# Patient Record
Sex: Female | Born: 1985 | Race: Black or African American | Hispanic: No | Marital: Single | Smoking: Never smoker
Health system: Southern US, Community
[De-identification: ages and names within clinical notes are randomized; demographics above are authoritative.]

## PROBLEM LIST (undated history)

## (undated) DIAGNOSIS — K219 Gastro-esophageal reflux disease without esophagitis: Secondary | ICD-10-CM

## (undated) DIAGNOSIS — R718 Other abnormality of red blood cells: Secondary | ICD-10-CM | POA: Insufficient documentation

## (undated) DIAGNOSIS — F419 Anxiety disorder, unspecified: Secondary | ICD-10-CM

## (undated) DIAGNOSIS — E559 Vitamin D deficiency, unspecified: Secondary | ICD-10-CM | POA: Insufficient documentation

## (undated) DIAGNOSIS — F32A Depression, unspecified: Secondary | ICD-10-CM

## (undated) DIAGNOSIS — T7840XA Allergy, unspecified, initial encounter: Secondary | ICD-10-CM

## (undated) DIAGNOSIS — E119 Type 2 diabetes mellitus without complications: Secondary | ICD-10-CM

## (undated) HISTORY — DX: Depression, unspecified: F32.A

## (undated) HISTORY — DX: Type 2 diabetes mellitus without complications: E11.9

## (undated) HISTORY — PX: OTHER SURGICAL HISTORY: SHX169

## (undated) HISTORY — DX: Allergy, unspecified, initial encounter: T78.40XA

## (undated) HISTORY — DX: Anxiety disorder, unspecified: F41.9

## (undated) HISTORY — DX: Gastro-esophageal reflux disease without esophagitis: K21.9

---

## 2006-08-29 ENCOUNTER — Emergency Department (HOSPITAL_COMMUNITY): Admission: EM | Admit: 2006-08-29 | Discharge: 2006-08-29 | Payer: Self-pay | Admitting: Emergency Medicine

## 2007-03-03 ENCOUNTER — Emergency Department (HOSPITAL_COMMUNITY): Admission: EM | Admit: 2007-03-03 | Discharge: 2007-03-03 | Payer: Self-pay | Admitting: Emergency Medicine

## 2008-06-25 ENCOUNTER — Emergency Department (HOSPITAL_COMMUNITY): Admission: EM | Admit: 2008-06-25 | Discharge: 2008-06-25 | Payer: Self-pay | Admitting: Emergency Medicine

## 2008-07-25 ENCOUNTER — Inpatient Hospital Stay (HOSPITAL_COMMUNITY): Admission: AD | Admit: 2008-07-25 | Discharge: 2008-07-25 | Payer: Self-pay | Admitting: Obstetrics and Gynecology

## 2008-10-13 ENCOUNTER — Inpatient Hospital Stay (HOSPITAL_COMMUNITY): Admission: AD | Admit: 2008-10-13 | Discharge: 2008-10-13 | Payer: Self-pay | Admitting: Radiology

## 2008-11-28 ENCOUNTER — Inpatient Hospital Stay (HOSPITAL_COMMUNITY): Admission: AD | Admit: 2008-11-28 | Discharge: 2008-11-28 | Payer: Self-pay | Admitting: Obstetrics & Gynecology

## 2009-02-05 ENCOUNTER — Inpatient Hospital Stay (HOSPITAL_COMMUNITY): Admission: AD | Admit: 2009-02-05 | Discharge: 2009-02-06 | Payer: Self-pay | Admitting: Obstetrics & Gynecology

## 2009-09-20 ENCOUNTER — Inpatient Hospital Stay (HOSPITAL_COMMUNITY): Admission: AD | Admit: 2009-09-20 | Discharge: 2009-09-20 | Payer: Self-pay | Admitting: Family Medicine

## 2010-01-13 ENCOUNTER — Emergency Department (HOSPITAL_COMMUNITY): Admission: EM | Admit: 2010-01-13 | Discharge: 2010-01-13 | Payer: Self-pay | Admitting: Emergency Medicine

## 2010-03-18 ENCOUNTER — Emergency Department (HOSPITAL_COMMUNITY): Admission: EM | Admit: 2010-03-18 | Discharge: 2010-03-18 | Payer: Self-pay | Admitting: Family Medicine

## 2010-07-01 ENCOUNTER — Emergency Department (HOSPITAL_COMMUNITY): Admission: EM | Admit: 2010-07-01 | Discharge: 2010-07-01 | Payer: Self-pay | Admitting: Emergency Medicine

## 2011-02-28 LAB — COMPREHENSIVE METABOLIC PANEL
Alkaline Phosphatase: 89 U/L (ref 39–117)
BUN: 7 mg/dL (ref 6–23)
CO2: 28 mEq/L (ref 19–32)
Calcium: 9.7 mg/dL (ref 8.4–10.5)
Creatinine, Ser: 0.61 mg/dL (ref 0.4–1.2)
GFR calc non Af Amer: >60 mL/min (ref >60)
Glucose, Bld: 90 mg/dL (ref 70–99)
Potassium: 4.5 mEq/L (ref 3.5–5.1)
Total Bilirubin: 0.6 mg/dL (ref 0.3–1.2)

## 2011-02-28 LAB — CBC
Hemoglobin: 13.4 g/dL (ref 12.0–15.0)
Platelets: 255 10*3/uL (ref 150–400)
RBC: 5.15 MIL/uL — ABNORMAL HIGH (ref 3.87–5.11)
WBC: 6.4 10*3/uL (ref 4.0–10.5)

## 2011-02-28 LAB — DIFFERENTIAL
Lymphs Abs: 1.7 10*3/uL (ref 0.7–4.0)
Neutro Abs: 3.9 10*3/uL (ref 1.7–7.7)
Neutrophils Relative %: 62 % (ref 43–77)

## 2011-02-28 LAB — POCT CARDIAC MARKERS
CKMB, poc: 1.3 ng/mL (ref 1.0–8.0)
Myoglobin, poc: 58.9 ng/mL (ref 12–200)
Troponin i, poc: <0.05 ng/mL (ref 0.00–0.09)

## 2011-03-04 LAB — POCT URINALYSIS DIP (DEVICE)
Hgb urine dipstick: NEGATIVE
Nitrite: NEGATIVE
Specific Gravity, Urine: >=1.03 (ref 1.005–1.030)
Urobilinogen, UA: 1 mg/dL (ref 0.0–1.0)

## 2011-03-19 LAB — DIFFERENTIAL
Eosinophils Relative: 1 % (ref 0–5)
Lymphocytes Relative: 18 % (ref 12–46)
Monocytes Absolute: 0.7 10*3/uL (ref 0.1–1.0)
Neutro Abs: 5.5 10*3/uL (ref 1.7–7.7)

## 2011-03-19 LAB — CBC
Hemoglobin: 12.9 g/dL (ref 12.0–15.0)
MCHC: 32.6 g/dL (ref 30.0–36.0)
MCV: 77.1 fL — ABNORMAL LOW (ref 78.0–100.0)
Platelets: 269 10*3/uL (ref 150–400)
RBC: 5.14 MIL/uL — ABNORMAL HIGH (ref 3.87–5.11)
RDW: 15.9 % — ABNORMAL HIGH (ref 11.5–15.5)

## 2011-03-19 LAB — WET PREP, GENITAL

## 2011-03-19 LAB — GC/CHLAMYDIA PROBE AMP, GENITAL: Chlamydia, DNA Probe: NEGATIVE

## 2011-03-31 LAB — URINALYSIS, ROUTINE W REFLEX MICROSCOPIC
Glucose, UA: NEGATIVE mg/dL
Hgb urine dipstick: NEGATIVE
Ketones, ur: 40 mg/dL — AB
Protein, ur: NEGATIVE mg/dL
Specific Gravity, Urine: >1.03 — ABNORMAL HIGH (ref 1.005–1.030)
Urobilinogen, UA: 1 mg/dL (ref 0.0–1.0)

## 2011-03-31 LAB — WET PREP, GENITAL
Trich, Wet Prep: NONE SEEN
Yeast Wet Prep HPF POC: NONE SEEN

## 2011-09-10 LAB — HCG, QUANTITATIVE, PREGNANCY: hCG, Beta Chain, Quant, S: 1336 — ABNORMAL HIGH

## 2011-09-10 LAB — WET PREP, GENITAL
Trich, Wet Prep: NONE SEEN
WBC, Wet Prep HPF POC: NONE SEEN
Yeast Wet Prep HPF POC: NONE SEEN

## 2011-09-10 LAB — URINE MICROSCOPIC-ADD ON

## 2011-09-10 LAB — URINALYSIS, ROUTINE W REFLEX MICROSCOPIC
Bilirubin Urine: NEGATIVE
Glucose, UA: NEGATIVE
Ketones, ur: NEGATIVE
Specific Gravity, Urine: 1.029
pH: 7

## 2011-09-10 LAB — POCT I-STAT, CHEM 8
BUN: 10
Calcium, Ion: 1.12
Glucose, Bld: 89
TCO2: 21

## 2011-09-10 LAB — URINE CULTURE: Colony Count: 75000

## 2011-09-10 LAB — GC/CHLAMYDIA PROBE AMP, GENITAL
Chlamydia, DNA Probe: NEGATIVE
GC Probe Amp, Genital: NEGATIVE

## 2011-09-10 LAB — RPR: RPR Ser Ql: NONREACTIVE

## 2011-09-11 LAB — WET PREP, GENITAL: Yeast Wet Prep HPF POC: NONE SEEN

## 2011-09-18 LAB — URINALYSIS, ROUTINE W REFLEX MICROSCOPIC
Hgb urine dipstick: NEGATIVE
Nitrite: NEGATIVE
Protein, ur: NEGATIVE mg/dL
Specific Gravity, Urine: 1.02 (ref 1.005–1.030)
Urobilinogen, UA: 1 mg/dL (ref 0.0–1.0)

## 2011-09-18 LAB — COMPREHENSIVE METABOLIC PANEL
Albumin: 2.7 g/dL — ABNORMAL LOW (ref 3.5–5.2)
Alkaline Phosphatase: 78 U/L (ref 39–117)
BUN: 6 mg/dL (ref 6–23)
CO2: 24 mEq/L (ref 19–32)
Chloride: 106 mEq/L (ref 96–112)
Creatinine, Ser: 0.53 mg/dL (ref 0.4–1.2)
GFR calc non Af Amer: >60 mL/min (ref >60)
Glucose, Bld: 105 mg/dL — ABNORMAL HIGH (ref 70–99)
Potassium: 4.2 mEq/L (ref 3.5–5.1)
Total Bilirubin: 0.5 mg/dL (ref 0.3–1.2)

## 2011-09-18 LAB — CBC
HCT: 36.2 % (ref 36.0–46.0)
Hemoglobin: 12.1 g/dL (ref 12.0–15.0)
MCV: 79.7 fL (ref 78.0–100.0)
RBC: 4.55 MIL/uL (ref 3.87–5.11)
WBC: 10 10*3/uL (ref 4.0–10.5)

## 2017-02-18 DIAGNOSIS — I10 Essential (primary) hypertension: Secondary | ICD-10-CM | POA: Insufficient documentation

## 2019-05-22 DIAGNOSIS — E119 Type 2 diabetes mellitus without complications: Secondary | ICD-10-CM | POA: Insufficient documentation

## 2020-10-24 ENCOUNTER — Encounter: Payer: Self-pay | Admitting: Family Medicine

## 2020-10-24 ENCOUNTER — Other Ambulatory Visit: Payer: Self-pay | Admitting: Family Medicine

## 2020-10-24 ENCOUNTER — Other Ambulatory Visit: Payer: Self-pay

## 2020-10-24 ENCOUNTER — Telehealth (INDEPENDENT_AMBULATORY_CARE_PROVIDER_SITE_OTHER): Payer: No Typology Code available for payment source | Admitting: Family Medicine

## 2020-10-24 VITALS — BP 136/80 | HR 89 | Temp 98.0°F | Ht 64.25 in | Wt 346.2 lb

## 2020-10-24 DIAGNOSIS — E1165 Type 2 diabetes mellitus with hyperglycemia: Secondary | ICD-10-CM | POA: Diagnosis not present

## 2020-10-24 DIAGNOSIS — Z7689 Persons encountering health services in other specified circumstances: Secondary | ICD-10-CM

## 2020-10-24 DIAGNOSIS — R87619 Unspecified abnormal cytological findings in specimens from cervix uteri: Secondary | ICD-10-CM

## 2020-10-24 DIAGNOSIS — I10 Essential (primary) hypertension: Secondary | ICD-10-CM

## 2020-10-24 DIAGNOSIS — F419 Anxiety disorder, unspecified: Secondary | ICD-10-CM

## 2020-10-24 DIAGNOSIS — F32A Depression, unspecified: Secondary | ICD-10-CM

## 2020-10-24 DIAGNOSIS — Z30433 Encounter for removal and reinsertion of intrauterine contraceptive device: Secondary | ICD-10-CM

## 2020-10-24 DIAGNOSIS — K219 Gastro-esophageal reflux disease without esophagitis: Secondary | ICD-10-CM

## 2020-10-24 MED ORDER — GLIPIZIDE ER 5 MG PO TB24: 5.0000 mg | ORAL_TABLET | Freq: Every day | ORAL | 3 refills | Status: DC

## 2020-10-24 MED ORDER — HYDROCHLOROTHIAZIDE 12.5 MG PO CAPS: 12.5000 mg | ORAL_CAPSULE | Freq: Every day | ORAL | 3 refills | Status: DC

## 2020-10-24 MED ORDER — METFORMIN HCL ER 500 MG PO TB24: 1000.0000 mg | ORAL_TABLET | Freq: Every day | ORAL | 3 refills | Status: DC

## 2020-10-24 MED ORDER — ESCITALOPRAM OXALATE 10 MG PO TABS: 10.0000 mg | ORAL_TABLET | Freq: Every day | ORAL | 3 refills | Status: DC

## 2020-10-24 MED ORDER — PANTOPRAZOLE SODIUM 40 MG PO TBEC: 40.0000 mg | DELAYED_RELEASE_TABLET | Freq: Every day | ORAL | 3 refills | Status: DC

## 2020-10-24 MED FILL — glipiZIDE XL 5 MG TB24: 5 | 90 days supply | Qty: 90 | Fill #0

## 2020-10-24 MED FILL — PANTOPRAZOLE SOD DR 40 MG T: 40 | 90 days supply | Qty: 90 | Fill #0

## 2020-10-24 MED FILL — METFORMIN HCL ER 500 MG TB2: 500 | 90 days supply | Qty: 180 | Fill #0

## 2020-10-24 MED FILL — HYDROCHLOROTHIAZIDE 12.5 MG: 12.5 | 90 days supply | Qty: 90 | Fill #0

## 2020-10-24 MED FILL — ESCITALOPRAM 10 MG TABLET: 10 | 90 days supply | Qty: 90 | Fill #0

## 2020-10-24 NOTE — Progress Notes (Signed)
Virtual Visit via Video Note  I connected with Cynthia Bautista on 10/24/20 at  2:00 PM EST by a video enabled telemedicine application and verified that I am speaking with the correct person using two identifiers. Location patient: home Location provider:  home office Persons participating in the virtual visit: patient, provider  I discussed the limitations of evaluation and management by telemedicine and the availability of in person appointments. The patient expressed understanding and agreed to proceed.  Chief Complaint  Patient presents with   Establish Care    NP- establish care and med refills on DM and GERD     HPI: Cynthia Bautista is a 34 y.o. female seen today as a new patient to establish care with our office. She has a PMHx significant for uncontrolled DM, GERD, HTN. She needs refills of her meds today. For DM pt is taking metformin XR 1000mg  daily, glipizide XL 5mg  daily. She does not check her BS at home. A1C in 02/2020 = 10. She has been out of metformin x 1 mo. For GERD, pt is taking protonix 40mg  daily and feels it is effective. She been out of this x 2 wks.  For HTN, pt takes HCTZ 12.5mg  daily.   Previous PCP:  Specialists: GYN at [facility] - needs mirena changed  Needs referral to GYN for removal and reinsertion of IUD and ? Abnormal PAP about 6 mo ago that she was told needed to be repeated.  She wants to discuss med for anxiety and depression. Brother passed away almost 2 years ago from covid. She was very close to her brother and still close to her SIL and nieces and nephew.    Past Medical History:  Diagnosis Date   Allergy    Anxiety    Depression    Diabetes mellitus without complication (HCC)    GERD (gastroesophageal reflux disease)     Past Surgical History:  Procedure Laterality Date   adnoids     tonsil      Family History  Problem Relation Age of Onset   Diabetes Mother    Hypertension Mother    Arthritis Mother     Diabetes Father    Hypertension Father    Hyperlipidemia Father    Arthritis Father    Cancer Paternal Grandmother    Kidney disease Paternal Grandmother    Hypertension Paternal Grandmother     Social History   Tobacco Use   Smoking status: Never Smoker   Smokeless tobacco: Never Used  03/2020 Use: Never used  Substance Use Topics   Alcohol use: Yes    Comment: occasional   Drug use: Never     Current Outpatient Medications:    albuterol (VENTOLIN HFA) 108 (90 Base) MCG/ACT inhaler, Inhale into the lungs., Disp: , Rfl:    Blood Glucose Monitoring Suppl (PRECISION XTRA) DEVI, Check blood glucose daily, Disp: , Rfl:    glipiZIDE (GLUCOTROL XL) 5 MG 24 hr tablet, Take 1 tablet (5 mg total) by mouth daily with breakfast., Disp: 90 tablet, Rfl: 3   glucose blood (ONETOUCH VERIO) test strip, Check blood glucose daily, Disp: , Rfl:    hydrochlorothiazide (MICROZIDE) 12.5 MG capsule, Take 1 capsule (12.5 mg total) by mouth daily., Disp: 90 capsule, Rfl: 3   metFORMIN (GLUCOPHAGE-XR) 500 MG 24 hr tablet, Take 2 tablets (1,000 mg total) by mouth daily with breakfast., Disp: 180 tablet, Rfl: 3   OneTouch Delica Lancets 33G MISC, Check blood glucose daily, Disp: ,  Rfl:    pantoprazole (PROTONIX) 40 MG tablet, Take 1 tablet (40 mg total) by mouth daily., Disp: 90 tablet, Rfl: 3   escitalopram (LEXAPRO) 10 MG tablet, Take 1 tablet (10 mg total) by mouth daily., Disp: 90 tablet, Rfl: 3   fluticasone (FLONASE) 50 MCG/ACT nasal spray, fluticasone propionate 50 mcg/actuation nasal spray,suspension  1 spray everyday to each nostril, Disp: , Rfl:   Allergies  Allergen Reactions   Morphine Anxiety      ROS: See pertinent positives and negatives per HPI.   EXAM:  VITALS per patient if applicable: BP 136/80    Pulse 89    Temp 98 F (36.7 C) (Temporal)    Ht 5' 4.25" (1.632 m)    Wt (!) 346 lb 3.2 oz (157 kg)    BMI 58.96 kg/m    GENERAL: alert,  oriented, appears well and in no acute distress, obese  HEENT: atraumatic, conjunctiva clear, no obvious abnormalities on inspection of external nose and ears  NECK: normal movements of the head and neck  LUNGS: on inspection no signs of respiratory distress, breathing rate appears normal, no obvious gross SOB, gasping or wheezing, no conversational dyspnea  CV: no obvious cyanosis  MS: moves all visible extremities without noticeable abnormality  PSYCH/NEURO: pleasant and cooperative, no obvious depression or anxiety, speech and thought processing grossly intact   ASSESSMENT AND PLAN:  1. Encounter to establish care with new doctor  2. Uncontrolled type 2 diabetes mellitus with hyperglycemia (HCC) - last A1C = 10.2 in 02/2020 - she has been out of metformin x 1 mo Refill: - metFORMIN (GLUCOPHAGE-XR) 500 MG 24 hr tablet; Take 2 tablets (1,000 mg total) by mouth daily with breakfast.  Dispense: 180 tablet; Refill: 3 - glipiZIDE (GLUCOTROL XL) 5 MG 24 hr tablet; Take 1 tablet (5 mg total) by mouth daily with breakfast.  Dispense: 90 tablet; Refill: 3 - Microalbumin / creatinine urine ratio; Future - Hemoglobin A1c; Future - Lipid panel; Future - CBC; Future - Comprehensive metabolic panel; Future  3. Gastroesophageal reflux disease, unspecified whether esophagitis present - controlled on med Refill: - pantoprazole (PROTONIX) 40 MG tablet; Take 1 tablet (40 mg total) by mouth daily.  Dispense: 90 tablet; Refill: 3  4. Primary hypertension - controlled, at goal Refill: - hydrochlorothiazide (MICROZIDE) 12.5 MG capsule; Take 1 capsule (12.5 mg total) by mouth daily.  Dispense: 90 capsule; Refill: 3 - Comprehensive metabolic panel; Future  5. Encounter for removal and reinsertion of IUD 6. Abnormal cervical Papanicolaou smear, unspecified abnormal pap finding - Ambulatory referral to Obstetrics / Gynecology  7. Anxiety and depression - 2 children, works full time, brother  passed away last year from covid Rx: - escitalopram (LEXAPRO) 10 MG tablet; Take 1 tablet (10 mg total) by mouth daily.  Dispense: 90 tablet; Refill: 3 - f/u in 3-4 wks or sooner PRN Discussed plan and reviewed medications with patient, including risks, benefits, and potential side effects. Pt expressed understand. All questions answered.     I discussed the assessment and treatment plan with the patient. The patient was provided an opportunity to ask questions and all were answered. The patient agreed with the plan and demonstrated an understanding of the instructions.   The patient was advised to call back or seek an in-person evaluation if the symptoms worsen or if the condition fails to improve as anticipated.   Luana Shu, DO

## 2020-10-25 ENCOUNTER — Other Ambulatory Visit: Payer: No Typology Code available for payment source

## 2020-10-28 ENCOUNTER — Other Ambulatory Visit: Payer: Self-pay

## 2020-10-28 ENCOUNTER — Other Ambulatory Visit (INDEPENDENT_AMBULATORY_CARE_PROVIDER_SITE_OTHER): Payer: No Typology Code available for payment source

## 2020-10-28 DIAGNOSIS — I10 Essential (primary) hypertension: Secondary | ICD-10-CM

## 2020-10-28 DIAGNOSIS — E1165 Type 2 diabetes mellitus with hyperglycemia: Secondary | ICD-10-CM

## 2020-10-28 LAB — COMPREHENSIVE METABOLIC PANEL
ALT: 12 U/L (ref 0–35)
AST: 16 U/L (ref 0–37)
Albumin: 3.9 g/dL (ref 3.5–5.2)
Alkaline Phosphatase: 62 U/L (ref 39–117)
BUN: 7 mg/dL (ref 6–23)
CO2: 28 mEq/L (ref 19–32)
Calcium: 9.3 mg/dL (ref 8.4–10.5)
Chloride: 101 mEq/L (ref 96–112)
Creatinine, Ser: 0.65 mg/dL (ref 0.40–1.20)
GFR: 115.2 mL/min (ref >60.00)
Glucose, Bld: 120 mg/dL — ABNORMAL HIGH (ref 70–99)
Potassium: 4.1 mEq/L (ref 3.5–5.1)
Sodium: 136 mEq/L (ref 135–145)
Total Bilirubin: 0.5 mg/dL (ref 0.2–1.2)
Total Protein: 7 g/dL (ref 6.0–8.3)

## 2020-10-28 LAB — LIPID PANEL
Cholesterol: 201 mg/dL — ABNORMAL HIGH (ref 0–200)
HDL: 42.7 mg/dL (ref >39.00)
LDL Cholesterol: 139 mg/dL — ABNORMAL HIGH (ref 0–99)
NonHDL: 158.52
Total CHOL/HDL Ratio: 5
Triglycerides: 100 mg/dL (ref 0.0–149.0)
VLDL: 20 mg/dL (ref 0.0–40.0)

## 2020-10-28 LAB — CBC
HCT: 41.7 % (ref 36.0–46.0)
Hemoglobin: 13.4 g/dL (ref 12.0–15.0)
MCHC: 32.2 g/dL (ref 30.0–36.0)
MCV: 78.5 fl (ref 78.0–100.0)
Platelets: 265 10*3/uL (ref 150.0–400.0)
RBC: 5.31 Mil/uL — ABNORMAL HIGH (ref 3.87–5.11)
RDW: 14.3 % (ref 11.5–15.5)
WBC: 6.1 10*3/uL (ref 4.0–10.5)

## 2020-10-28 LAB — HEMOGLOBIN A1C: Hgb A1c MFr Bld: 6.6 % — ABNORMAL HIGH (ref 4.6–6.5)

## 2020-10-28 LAB — MICROALBUMIN / CREATININE URINE RATIO
Creatinine,U: 264.8 mg/dL
Microalb Creat Ratio: 0.7 mg/g (ref 0.0–30.0)
Microalb, Ur: 1.9 mg/dL (ref 0.0–1.9)

## 2020-10-28 NOTE — Addendum Note (Signed)
Addended by: Varney Biles on: 10/28/2020 10:59 AM   Modules accepted: Orders

## 2020-10-30 ENCOUNTER — Encounter: Payer: Self-pay | Admitting: Family Medicine

## 2020-10-30 DIAGNOSIS — E785 Hyperlipidemia, unspecified: Secondary | ICD-10-CM

## 2020-10-30 DIAGNOSIS — E119 Type 2 diabetes mellitus without complications: Secondary | ICD-10-CM

## 2020-10-31 ENCOUNTER — Other Ambulatory Visit: Payer: Self-pay | Admitting: Family Medicine

## 2020-10-31 MED ORDER — ATORVASTATIN CALCIUM 10 MG PO TABS: 10.0000 mg | ORAL_TABLET | Freq: Every day | ORAL | 3 refills | Status: DC

## 2020-10-31 MED FILL — ATORVASTATIN CALCIUM 10 MG: 10 | 90 days supply | Qty: 90 | Fill #0

## 2020-11-12 MED FILL — ATORVASTATIN CALCIUM 10 MG: 10 | 90 days supply | Qty: 90 | Fill #0

## 2020-11-13 ENCOUNTER — Encounter: Payer: Self-pay | Admitting: Family Medicine

## 2020-11-13 DIAGNOSIS — F32A Depression, unspecified: Secondary | ICD-10-CM

## 2020-11-14 ENCOUNTER — Other Ambulatory Visit: Payer: Self-pay | Admitting: Family Medicine

## 2020-11-14 ENCOUNTER — Ambulatory Visit: Payer: Self-pay | Admitting: Family Medicine

## 2020-11-14 MED ORDER — FLUOXETINE HCL 10 MG PO CAPS: 10.0000 mg | ORAL_CAPSULE | Freq: Every day | ORAL | 2 refills | Status: DC

## 2020-11-14 MED FILL — FLUoxetine HCL 10 MG CAPS: 10 | 30 days supply | Qty: 30 | Fill #0

## 2020-12-05 ENCOUNTER — Encounter: Payer: No Typology Code available for payment source | Admitting: Family Medicine

## 2020-12-17 ENCOUNTER — Encounter: Payer: Self-pay | Admitting: Family Medicine

## 2020-12-17 DIAGNOSIS — K219 Gastro-esophageal reflux disease without esophagitis: Secondary | ICD-10-CM

## 2020-12-18 ENCOUNTER — Other Ambulatory Visit: Payer: Self-pay | Admitting: Family Medicine

## 2020-12-18 ENCOUNTER — Ambulatory Visit: Payer: No Typology Code available for payment source | Admitting: Family Medicine

## 2020-12-18 MED ORDER — PANTOPRAZOLE SODIUM 40 MG PO TBEC: 40.0000 mg | DELAYED_RELEASE_TABLET | Freq: Two times a day (BID) | ORAL | 3 refills | Status: DC

## 2020-12-18 MED FILL — PANTOPRAZOLE SOD DR 40 MG T: 40 | 90 days supply | Qty: 180 | Fill #0

## 2020-12-19 ENCOUNTER — Other Ambulatory Visit: Payer: Self-pay

## 2020-12-20 ENCOUNTER — Other Ambulatory Visit: Payer: Self-pay | Admitting: Family Medicine

## 2020-12-20 ENCOUNTER — Ambulatory Visit (INDEPENDENT_AMBULATORY_CARE_PROVIDER_SITE_OTHER): Payer: No Typology Code available for payment source | Admitting: Family Medicine

## 2020-12-20 ENCOUNTER — Encounter: Payer: Self-pay | Admitting: Family Medicine

## 2020-12-20 VITALS — BP 126/74 | HR 89 | Temp 98.0°F | Ht 64.5 in | Wt 348.2 lb

## 2020-12-20 DIAGNOSIS — F419 Anxiety disorder, unspecified: Secondary | ICD-10-CM | POA: Diagnosis not present

## 2020-12-20 DIAGNOSIS — F32A Depression, unspecified: Secondary | ICD-10-CM | POA: Diagnosis not present

## 2020-12-20 MED ORDER — ALBUTEROL SULFATE HFA 108 (90 BASE) MCG/ACT IN AERS: 2.0000 | INHALATION_SPRAY | RESPIRATORY_TRACT | 2 refills | Status: DC | PRN

## 2020-12-20 MED FILL — ALBUTEROL SULFATE HFA 108 (: 108 (90 BAS | 17 days supply | Qty: 18 | Fill #0

## 2020-12-20 NOTE — Progress Notes (Signed)
Cynthia Bautista is a 35 y.o. female  Chief Complaint  Patient presents with  . Follow-up    F/u meds. Fluoxetine working well for her.  She wants refill on Albuterol inhaler.     HPI: Cynthia Bautista is a 35 y.o. female seen today for f/u on anxiety and depression. Pt is taking prozac 10mg  daily. She did not tolerate lexapro.  Today, pt reports prozac is a good fit. She feels "mellowed out" compared to when off medication. No side effects.    GAD 7 : Generalized Anxiety Score 10/24/2020  Nervous, Anxious, on Edge 0  Control/stop worrying 1  Worry too much - different things 1  Trouble relaxing 1  Restless 0  Easily annoyed or irritable 1  Afraid - awful might happen 0  Total GAD 7 Score 4  Anxiety Difficulty Somewhat difficult    Depression screen PHQ 2/9 10/24/2020  Decreased Interest 1  Down, Depressed, Hopeless 1  PHQ - 2 Score 2  Altered sleeping 1  Tired, decreased energy 1  Change in appetite 1  Feeling bad or failure about yourself  0  Trouble concentrating 0  Moving slowly or fidgety/restless 0  Suicidal thoughts 0  PHQ-9 Score 5  Difficult doing work/chores Somewhat difficult     Past Medical History:  Diagnosis Date  . Allergy   . Anxiety   . Depression   . Diabetes mellitus without complication (HCC)   . GERD (gastroesophageal reflux disease)     Past Surgical History:  Procedure Laterality Date  . adnoids    . tonsil      Social History   Socioeconomic History  . Marital status: Single    Spouse name: Not on file  . Number of children: Not on file  . Years of education: Not on file  . Highest education level: Not on file  Occupational History  . Not on file  Tobacco Use  . Smoking status: Never Smoker  . Smokeless tobacco: Never Used  Vaping Use  . Vaping Use: Never used  Substance and Sexual Activity  . Alcohol use: Yes    Comment: occasional  . Drug use: Never  . Sexual activity: Not Currently  Other Topics Concern  . Not on  file  Social History Narrative  . Not on file   Social Determinants of Health   Financial Resource Strain: Not on file  Food Insecurity: Not on file  Transportation Needs: Not on file  Physical Activity: Not on file  Stress: Not on file  Social Connections: Not on file  Intimate Partner Violence: Not on file    Family History  Problem Relation Age of Onset  . Diabetes Mother   . Hypertension Mother   . Arthritis Mother   . Diabetes Father   . Hypertension Father   . Hyperlipidemia Father   . Arthritis Father   . Cancer Paternal Grandmother   . Kidney disease Paternal Grandmother   . Hypertension Paternal Grandmother      Immunization History  Administered Date(s) Administered  . Influenza-Unspecified 09/18/2020  . PFIZER SARS-COV-2 Vaccination 03/27/2020, 04/18/2020    Outpatient Encounter Medications as of 12/20/2020  Medication Sig  . albuterol (VENTOLIN HFA) 108 (90 Base) MCG/ACT inhaler Inhale into the lungs.  02/17/2021 atorvastatin (LIPITOR) 10 MG tablet Take 1 tablet (10 mg total) by mouth daily.  . Blood Glucose Monitoring Suppl (PRECISION XTRA) DEVI Check blood glucose daily  . FLUoxetine (PROZAC) 10 MG capsule Take 1 capsule (10 mg total)  by mouth daily.  Marland Kitchen glipiZIDE (GLUCOTROL XL) 5 MG 24 hr tablet Take 1 tablet (5 mg total) by mouth daily with breakfast.  . glucose blood (ONETOUCH VERIO) test strip Check blood glucose daily  . hydrochlorothiazide (MICROZIDE) 12.5 MG capsule Take 1 capsule (12.5 mg total) by mouth daily.  . metFORMIN (GLUCOPHAGE-XR) 500 MG 24 hr tablet Take 2 tablets (1,000 mg total) by mouth daily with breakfast.  . OneTouch Delica Lancets 33G MISC Check blood glucose daily  . pantoprazole (PROTONIX) 40 MG tablet Take 1 tablet (40 mg total) by mouth 2 (two) times daily.  . fluticasone (FLONASE) 50 MCG/ACT nasal spray fluticasone propionate 50 mcg/actuation nasal spray,suspension  1 spray everyday to each nostril (Patient not taking: Reported on  12/20/2020)   No facility-administered encounter medications on file as of 12/20/2020.     ROS: Pertinent positives and negatives noted in HPI. Remainder of ROS non-contributory    Allergies  Allergen Reactions  . Morphine Anxiety    BP 126/74   Pulse 89   Temp 98 F (36.7 C) (Temporal)   Ht 5' 4.5" (1.638 m)   Wt (!) 348 lb 3.2 oz (157.9 kg)   SpO2 98%   BMI 58.85 kg/m   Physical Exam Constitutional:      General: She is not in acute distress.    Appearance: Normal appearance. She is not ill-appearing.  Pulmonary:     Effort: No respiratory distress.  Neurological:     Mental Status: She is alert and oriented to person, place, and time.  Psychiatric:        Mood and Affect: Mood normal.        Behavior: Behavior normal.      A/P:  1. Anxiety and depression - much-improved on prozac 10mg   - did not tolerate lexapro - pt is planning to start regular exercise with daughter - f/u PRN   This visit occurred during the SARS-CoV-2 public health emergency.  Safety protocols were in place, including screening questions prior to the visit, additional usage of staff PPE, and extensive cleaning of exam room while observing appropriate contact time as indicated for disinfecting solutions.

## 2020-12-31 MED FILL — FLUoxetine HCL 10 MG CAPS: 10 | 30 days supply | Qty: 30 | Fill #1

## 2021-01-31 ENCOUNTER — Encounter: Payer: No Typology Code available for payment source | Admitting: Obstetrics & Gynecology

## 2021-02-03 ENCOUNTER — Other Ambulatory Visit (HOSPITAL_COMMUNITY): Payer: Self-pay

## 2021-02-03 MED FILL — IBUPROFEN 400 MG TABS: 400 | 2 days supply | Qty: 16 | Fill #0

## 2021-02-14 MED FILL — FLUoxetine HCL 10 MG CAPS: 10 | 30 days supply | Qty: 30 | Fill #2

## 2021-02-14 MED FILL — glipiZIDE XL 5 MG TB24: 5 | 90 days supply | Qty: 90 | Fill #1

## 2021-02-14 MED FILL — ATORVASTATIN CALCIUM 10 MG: 10 | 90 days supply | Qty: 90 | Fill #1

## 2021-03-05 ENCOUNTER — Encounter: Payer: No Typology Code available for payment source | Admitting: Family Medicine

## 2021-03-06 ENCOUNTER — Other Ambulatory Visit: Payer: Self-pay | Admitting: Family Medicine

## 2021-03-06 ENCOUNTER — Ambulatory Visit (INDEPENDENT_AMBULATORY_CARE_PROVIDER_SITE_OTHER): Payer: No Typology Code available for payment source | Admitting: Family Medicine

## 2021-03-06 ENCOUNTER — Other Ambulatory Visit: Payer: Self-pay

## 2021-03-06 ENCOUNTER — Encounter: Payer: Self-pay | Admitting: Family Medicine

## 2021-03-06 VITALS — BP 140/100 | HR 96 | Ht 64.5 in | Wt 348.0 lb

## 2021-03-06 DIAGNOSIS — E119 Type 2 diabetes mellitus without complications: Secondary | ICD-10-CM

## 2021-03-06 DIAGNOSIS — E785 Hyperlipidemia, unspecified: Secondary | ICD-10-CM

## 2021-03-06 DIAGNOSIS — I10 Essential (primary) hypertension: Secondary | ICD-10-CM | POA: Diagnosis not present

## 2021-03-06 DIAGNOSIS — F32A Depression, unspecified: Secondary | ICD-10-CM

## 2021-03-06 DIAGNOSIS — F419 Anxiety disorder, unspecified: Secondary | ICD-10-CM

## 2021-03-06 LAB — POCT GLYCOSYLATED HEMOGLOBIN (HGB A1C): Hemoglobin A1C: 6.3 % — AB (ref 4.0–5.6)

## 2021-03-06 MED ORDER — FLUOXETINE HCL 20 MG PO CAPS: 20.0000 mg | ORAL_CAPSULE | Freq: Every day | ORAL | 3 refills | Status: DC

## 2021-03-06 MED FILL — glipiZIDE XL 5 MG TB24: 5 | 90 days supply | Qty: 90 | Fill #1

## 2021-03-06 MED FILL — FLUoxetine HCL 20 MG CAPS: 20 | 90 days supply | Qty: 90 | Fill #0

## 2021-03-06 MED FILL — ATORVASTATIN CALCIUM 10 MG: 10 | 90 days supply | Qty: 90 | Fill #1

## 2021-03-06 NOTE — Progress Notes (Signed)
Chief Complaint  Patient presents with  . Diabetes    3 month follow up.    HPI: *Cynthia Bautista is a 35 y.o. female here for DM, HTN, HLD follow-up. For DM, pt is taking metformin 1000mg  daily in AM, glipizide 5mg  daily. For HLD, pt is taking lipitor 10mg  daily. For HTN, pt is taking HCTZ 12.5mg  daily.  Pt does not check BS at home.  Hypoglycemia/Hypergylcemic episodes: no  Diet: more water, fruit and veggie with each meal Exercise: not much, started walking daily for the past 3 days   Lab Results  Component Value Date   HGBA1C 6.3 (A) 03/06/2021   Lab Results  Component Value Date   MICROALBUR 1.9 10/28/2020   Lab Results  Component Value Date   CREATININE 0.65 10/28/2020   Lab Results  Component Value Date   CHOL 201 (H) 10/28/2020   HDL 42.70 10/28/2020   LDLCALC 139 (H) 10/28/2020   TRIG 100.0 10/28/2020   CHOLHDL 5 10/28/2020    She would like to increase prozac from 10mg  daily to 20mg  daily due to increased stress/anxiety. She would like referral to [facility].   Past Medical History:  Diagnosis Date  . Allergy   . Anxiety   . Depression   . Diabetes mellitus without complication (HCC)   . GERD (gastroesophageal reflux disease)     Past Surgical History:  Procedure Laterality Date  . adnoids    . tonsil      Social History   Socioeconomic History  . Marital status: Single    Spouse name: Not on file  . Number of children: Not on file  . Years of education: Not on file  . Highest education level: Not on file  Occupational History  . Not on file  Tobacco Use  . Smoking status: Never Smoker  . Smokeless tobacco: Never Used  Vaping Use  . Vaping Use: Never used  Substance and Sexual Activity  . Alcohol use: Yes    Comment: occasional  . Drug use: Never  . Sexual activity: Not Currently  Other Topics Concern  . Not on file  Social History Narrative  . Not on file   Social Determinants of Health   Financial  Resource Strain: Not on file  Food Insecurity: Not on file  Transportation Needs: Not on file  Physical Activity: Not on file  Stress: Not on file  Social Connections: Not on file  Intimate Partner Violence: Not on file    Family History  Problem Relation Age of Onset  . Diabetes Mother   . Hypertension Mother   . Arthritis Mother   . Diabetes Father   . Hypertension Father   . Hyperlipidemia Father   . Arthritis Father   . Cancer Paternal Grandmother   . Kidney disease Paternal Grandmother   . Hypertension Paternal Grandmother      Immunization History  Administered Date(s) Administered  . Influenza-Unspecified 09/18/2020  . PFIZER(Purple Top)SARS-COV-2 Vaccination 03/27/2020, 04/18/2020    Outpatient Encounter Medications as of 03/06/2021  Medication Sig  . albuterol (VENTOLIN HFA) 108 (90 Base) MCG/ACT inhaler Inhale 2 puffs into the lungs every 4 (four) hours as needed for wheezing or shortness of breath.  [facility] atorvastatin (LIPITOR) 10 MG tablet Take 1 tablet (10 mg total) by mouth daily.  . Blood Glucose Monitoring Suppl (PRECISION XTRA) DEVI Check blood glucose daily  . glipiZIDE (GLUCOTROL XL) 5 MG 24 hr tablet Take 1 tablet (5 mg total) by  mouth daily with breakfast.  . glucose blood (ONETOUCH VERIO) test strip Check blood glucose daily  . hydrochlorothiazide (MICROZIDE) 12.5 MG capsule Take 1 capsule (12.5 mg total) by mouth daily.  . metFORMIN (GLUCOPHAGE-XR) 500 MG 24 hr tablet Take 2 tablets (1,000 mg total) by mouth daily with breakfast.  . OneTouch Delica Lancets 33G MISC Check blood glucose daily  . pantoprazole (PROTONIX) 40 MG tablet Take 1 tablet (40 mg total) by mouth 2 (two) times daily.  . [DISCONTINUED] FLUoxetine (PROZAC) 10 MG capsule Take 1 capsule (10 mg total) by mouth daily.  Marland Kitchen FLUoxetine (PROZAC) 20 MG capsule Take 1 capsule (20 mg total) by mouth daily.  Marland Kitchen ibuprofen (ADVIL) 400 MG tablet SMARTSIG:1-2 Tablet(s) By Mouth 3-4 Times Daily PRN   No  facility-administered encounter medications on file as of 03/06/2021.     ROS: Pertinent positives and negatives noted in HPI. Remainder of ROS non-contributory   Allergies  Allergen Reactions  . Morphine Anxiety    BP (!) 140/100 (BP Location: Left Arm, Patient Position: Sitting, Cuff Size: Large)   Pulse 96   Ht 5' 4.5" (1.638 m)   Wt (!) 348 lb (157.9 kg)   SpO2 97%   BMI 58.81 kg/m   Wt Readings from Last 3 Encounters:  03/06/21 (!) 348 lb (157.9 kg)  12/20/20 (!) 348 lb 3.2 oz (157.9 kg)  10/24/20 (!) 346 lb 3.2 oz (157 kg)   Temp Readings from Last 3 Encounters:  12/20/20 98 F (36.7 C) (Temporal)  10/24/20 98 F (36.7 C) (Temporal)   BP Readings from Last 3 Encounters:  03/06/21 (!) 140/100  12/20/20 126/74  10/24/20 136/80   Pulse Readings from Last 3 Encounters:  03/06/21 96  12/20/20 89  10/24/20 89     Physical Exam Constitutional:      General: She is not in acute distress.    Appearance: She is obese. She is not ill-appearing.  Cardiovascular:     Rate and Rhythm: Normal rate and regular rhythm.     Pulses: Normal pulses.     Heart sounds: Normal heart sounds. No murmur heard.   Pulmonary:     Effort: Pulmonary effort is normal. No respiratory distress.     Breath sounds: Normal breath sounds. No wheezing or rhonchi.  Musculoskeletal:     Right lower leg: No edema.     Left lower leg: No edema.  Neurological:     Mental Status: She is alert.  Psychiatric:        Mood and Affect: Mood normal.        Behavior: Behavior normal.     A/P:  1. Controlled type 2 diabetes mellitus without complication, without long-term current use of insulin (HCC) - A1C improved from 6.6 to 6.3!! - cont current meds - metformin 100mg  daily in AM, glipizide 5mg  daily - cont regular walking, low carb diet - POCT HgB A1C - f/u in 3-14mo or sooner PRN  2. Hyperlipidemia, unspecified hyperlipidemia type - stable, last FLP in 10/2020 - cont on lipitor 10mg   daily - FLP at next OV in 3-40mo  3. Primary hypertension - elevated today, pt did not take med this AM and increased stress today d/t daughter's medical appt. Previously well-controlled and at goal - recheck in 3 mo  4. Anxiety and depression Increase: - FLUoxetine (PROZAC) 20 MG capsule; Take 1 capsule (20 mg total) by mouth daily.  Dispense: 90 capsule; Refill: 3 - up from 10mg  to 20mg  - Ambulatory  referral to Psychology    This visit occurred during the SARS-CoV-2 public health emergency.  Safety protocols were in place, including screening questions prior to the visit, additional usage of staff PPE, and extensive cleaning of exam room while observing appropriate contact time as indicated for disinfecting solutions.

## 2021-04-09 ENCOUNTER — Encounter: Payer: No Typology Code available for payment source | Admitting: Family Medicine

## 2021-04-23 ENCOUNTER — Encounter: Payer: No Typology Code available for payment source | Admitting: Family Medicine

## 2021-05-16 ENCOUNTER — Other Ambulatory Visit (HOSPITAL_COMMUNITY): Payer: Self-pay

## 2021-05-16 ENCOUNTER — Other Ambulatory Visit: Payer: Self-pay | Admitting: Family Medicine

## 2021-05-16 DIAGNOSIS — B3731 Acute candidiasis of vulva and vagina: Secondary | ICD-10-CM

## 2021-05-16 DIAGNOSIS — B373 Candidiasis of vulva and vagina: Secondary | ICD-10-CM

## 2021-05-16 MED ORDER — FLUCONAZOLE 150 MG PO TABS
150.0000 mg | ORAL_TABLET | Freq: Once | ORAL | 0 refills | Status: AC
  Filled 2021-05-16: qty 2, 2d supply, fill #0

## 2021-05-19 ENCOUNTER — Other Ambulatory Visit (HOSPITAL_COMMUNITY): Payer: Self-pay

## 2021-05-19 MED FILL — Hydrochlorothiazide Cap 12.5 MG: ORAL | 90 days supply | Qty: 90 | Fill #0 | Status: AC

## 2021-05-19 MED FILL — Pantoprazole Sodium EC Tab 40 MG (Base Equiv): ORAL | 90 days supply | Qty: 180 | Fill #0 | Status: AC

## 2021-06-06 ENCOUNTER — Other Ambulatory Visit (HOSPITAL_COMMUNITY): Payer: Self-pay

## 2021-06-06 ENCOUNTER — Telehealth: Payer: Self-pay

## 2021-06-06 MED ORDER — FREESTYLE LANCETS MISC
0 refills | Status: AC
  Filled 2021-06-06: qty 200, 50d supply, fill #0

## 2021-06-06 MED ORDER — FREESTYLE LITE TEST VI STRP
ORAL_STRIP | 0 refills | Status: AC
  Filled 2021-06-06: qty 200, 50d supply, fill #0

## 2021-06-06 MED ORDER — FREESTYLE LITE W/DEVICE KIT
PACK | 0 refills | Status: AC
  Filled 2021-06-06: qty 1, fill #0
  Filled 2021-06-06: qty 1, 1d supply, fill #0

## 2021-06-06 MED FILL — Metformin HCl Tab ER 24HR 500 MG: ORAL | 90 days supply | Qty: 180 | Fill #0 | Status: AC

## 2021-06-06 NOTE — Telephone Encounter (Signed)
Pt requesting meter that insurance will cover to be sent to her pharmacy.  Freestyle libra is covered.

## 2021-06-12 ENCOUNTER — Other Ambulatory Visit: Payer: Self-pay | Admitting: Family Medicine

## 2021-06-12 ENCOUNTER — Ambulatory Visit (INDEPENDENT_AMBULATORY_CARE_PROVIDER_SITE_OTHER): Payer: No Typology Code available for payment source | Admitting: Family Medicine

## 2021-06-12 ENCOUNTER — Other Ambulatory Visit: Payer: Self-pay

## 2021-06-12 ENCOUNTER — Encounter: Payer: Self-pay | Admitting: Family Medicine

## 2021-06-12 VITALS — BP 130/100 | HR 96 | Temp 97.1°F | Ht 64.25 in | Wt 341.6 lb

## 2021-06-12 DIAGNOSIS — I10 Essential (primary) hypertension: Secondary | ICD-10-CM | POA: Diagnosis not present

## 2021-06-12 DIAGNOSIS — E1165 Type 2 diabetes mellitus with hyperglycemia: Secondary | ICD-10-CM

## 2021-06-12 DIAGNOSIS — E119 Type 2 diabetes mellitus without complications: Secondary | ICD-10-CM | POA: Diagnosis not present

## 2021-06-12 DIAGNOSIS — Z111 Encounter for screening for respiratory tuberculosis: Secondary | ICD-10-CM

## 2021-06-12 DIAGNOSIS — E785 Hyperlipidemia, unspecified: Secondary | ICD-10-CM

## 2021-06-12 DIAGNOSIS — F32A Depression, unspecified: Secondary | ICD-10-CM

## 2021-06-12 DIAGNOSIS — F419 Anxiety disorder, unspecified: Secondary | ICD-10-CM

## 2021-06-12 LAB — BASIC METABOLIC PANEL
BUN: 11 mg/dL (ref 6–23)
CO2: 25 mEq/L (ref 19–32)
Calcium: 9.4 mg/dL (ref 8.4–10.5)
Chloride: 102 mEq/L (ref 96–112)
Creatinine, Ser: 0.63 mg/dL (ref 0.40–1.20)
GFR: 115.57 mL/min (ref >60.00)
Glucose, Bld: 261 mg/dL — ABNORMAL HIGH (ref 70–99)
Potassium: 4.1 mEq/L (ref 3.5–5.1)
Sodium: 136 mEq/L (ref 135–145)

## 2021-06-12 LAB — LIPID PANEL
Cholesterol: 169 mg/dL (ref 0–200)
HDL: 35.3 mg/dL — ABNORMAL LOW (ref >39.00)
LDL Cholesterol: 112 mg/dL — ABNORMAL HIGH (ref 0–99)
NonHDL: 134.1
Total CHOL/HDL Ratio: 5
Triglycerides: 112 mg/dL (ref 0.0–149.0)
VLDL: 22.4 mg/dL (ref 0.0–40.0)

## 2021-06-12 LAB — HEMOGLOBIN A1C: Hgb A1c MFr Bld: 10.5 % — ABNORMAL HIGH (ref 4.6–6.5)

## 2021-06-12 NOTE — Progress Notes (Signed)
Chief Complaint  Patient presents with   Follow-up    3 month follow up     HPI: *Cynthia Bautista is a 35 y.o. female here for DM, HTN, HLD follow-up. She had labs done earlier today.  For DM, pt is taking metformin 1051m qAM and glipizide 516mdaily. For HTN, pt is taking HCTZ 12.74m64maily For HLD, pt is taking lipitor 77m274mily. No side effects.   Pt complains of 2 week h/o fatigue so she started to check sugar and re-evaluate what she is eating.   Pt does check BS at home. Readings: 389, 304, 280, 260, 240.  Diet: more processed foods, 2 32oz  juice drink every other day  Lab Results  Component Value Date   HGBA1C 10.5 (H) 06/12/2021  Was 6.3 in 02/2021, 6.6 in 10/2020  Lab Results  Component Value Date   MICROALBUR 1.9 10/28/2020   Lab Results  Component Value Date   CREATININE 0.63 06/12/2021   Lab Results  Component Value Date   NA 136 06/12/2021   K 4.1 06/12/2021   CREATININE 0.63 06/12/2021   GFRNONAA >60 07/01/2010   GFRAA  07/01/2010    >60        The eGFR has been calculated using the MDRD equation. This calculation has not been validated in all clinical situations. eGFR's persistently <60 mL/min signify possible Chronic Kidney Disease.   GLUCOSE 261 (H) 06/12/2021    Lab Results  Component Value Date   CHOL 169 06/12/2021   HDL 35.30 (L) 06/12/2021   LDLCALC 112 (H) 06/12/2021   TRIG 112.0 06/12/2021   CHOLHDL 5 06/12/2021    The ASCVD Risk score (GofMikey BussingJr., et al., 2013) failed to calculate for the following reasons:   The 2013 ASCVD risk score is only valid for ages 40 t8379  39ast Medical History:  Diagnosis Date   Allergy    Anxiety    Depression    Diabetes mellitus without complication (HCC)    GERD (gastroesophageal reflux disease)     Past Surgical History:  Procedure Laterality Date   adnoids     tonsil      Social History   Socioeconomic History   Marital status: Single    Spouse name: Not on file    Number of children: Not on file   Years of education: Not on file   Highest education level: Not on file  Occupational History   Not on file  Tobacco Use   Smoking status: Never   Smokeless tobacco: Never  Vaping Use   Vaping Use: Never used  Substance and Sexual Activity   Alcohol use: Yes    Comment: occasional   Drug use: Never   Sexual activity: Not Currently  Other Topics Concern   Not on file  Social History Narrative   Not on file   Social Determinants of Health   Financial Resource Strain: Not on file  Food Insecurity: Not on file  Transportation Needs: Not on file  Physical Activity: Not on file  Stress: Not on file  Social Connections: Not on file  Intimate Partner Violence: Not on file    Family History  Problem Relation Age of Onset   Diabetes Mother    Hypertension Mother    Arthritis Mother    Diabetes Father    Hypertension Father    Hyperlipidemia Father    Arthritis Father    Cancer Paternal Grandmother    Kidney disease  Paternal Grandmother    Hypertension Paternal Grandmother      Immunization History  Administered Date(s) Administered   Influenza-Unspecified 09/18/2020   PFIZER(Purple Top)SARS-COV-2 Vaccination 03/27/2020, 04/18/2020    Outpatient Encounter Medications as of 06/12/2021  Medication Sig   albuterol (VENTOLIN HFA) 108 (90 Base) MCG/ACT inhaler INHALE 2 PUFFS INTO THE LUNGS EVERY 4 (FOUR) HOURS AS NEEDED FOR WHEEZING OR SHORTNESS OF BREATH.   atorvastatin (LIPITOR) 10 MG tablet TAKE 1 TABLET (10 MG TOTAL) BY MOUTH DAILY.   Blood Glucose Monitoring Suppl (FREESTYLE LITE) w/Device KIT Use as directed up to 4 times daily   Blood Glucose Monitoring Suppl (PRECISION XTRA) DEVI Check blood glucose daily   FLUoxetine (PROZAC) 20 MG capsule TAKE 1 CAPSULE (20 MG TOTAL) BY MOUTH DAILY.   glipiZIDE (GLUCOTROL XL) 5 MG 24 hr tablet TAKE 1 TABLET (5 MG TOTAL) BY MOUTH DAILY WITH BREAKFAST.   glucose blood (FREESTYLE LITE) test strip Use  as directed up to 4 times daily   glucose blood (ONETOUCH VERIO) test strip Check blood glucose daily   hydrochlorothiazide (MICROZIDE) 12.5 MG capsule TAKE 1 CAPSULE (12.5 MG TOTAL) BY MOUTH DAILY.   ibuprofen (ADVIL) 400 MG tablet TAKE 1 TO 2 TABLETS BY MOUTH 3 TIMES DAILY TO 4 TIMES DAILY AS NEEDED FOR THE RELIEF OF PAIN   Lancets (FREESTYLE) lancets Use as directed up to 4 times daily   levonorgestrel (MIRENA) 20 MCG/DAY IUD 1 each by Intrauterine route once.   metFORMIN (GLUCOPHAGE-XR) 500 MG 24 hr tablet Take 2 tablets (1,000 mg total) by mouth daily with breakfast.   OneTouch Delica Lancets 38G MISC Check blood glucose daily   pantoprazole (PROTONIX) 40 MG tablet TAKE 1 TABLET (40 MG TOTAL) BY MOUTH 2 (TWO) TIMES DAILY.   ibuprofen (ADVIL) 400 MG tablet SMARTSIG:1-2 Tablet(s) By Mouth 3-4 Times Daily PRN   No facility-administered encounter medications on file as of 06/12/2021.     ROS: Pertinent positives and negatives noted in HPI. Remainder of ROS non-contributory   Allergies  Allergen Reactions   Morphine Anxiety    BP (!) 130/100 (BP Location: Left Arm, Patient Position: Sitting, Cuff Size: Large)   Pulse 96   Temp (!) 97.1 F (36.2 C) (Temporal)   Ht 5' 4.25" (1.632 m)   Wt (!) 341 lb 9.6 oz (154.9 kg)   SpO2 98%   BMI 58.18 kg/m  Wt Readings from Last 3 Encounters:  06/12/21 (!) 341 lb 9.6 oz (154.9 kg)  03/06/21 (!) 348 lb (157.9 kg)  12/20/20 (!) 348 lb 3.2 oz (157.9 kg)   Temp Readings from Last 3 Encounters:  06/12/21 (!) 97.1 F (36.2 C) (Temporal)  12/20/20 98 F (36.7 C) (Temporal)  10/24/20 98 F (36.7 C) (Temporal)   BP Readings from Last 3 Encounters:  06/12/21 (!) 130/100  03/06/21 (!) 140/100  12/20/20 126/74   Pulse Readings from Last 3 Encounters:  06/12/21 96  03/06/21 96  12/20/20 89    Physical Exam Constitutional:      General: She is not in acute distress.    Appearance: She is obese. She is not ill-appearing.   Cardiovascular:     Rate and Rhythm: Normal rate and regular rhythm.     Pulses: Normal pulses.     Heart sounds: Normal heart sounds.  Pulmonary:     Effort: Pulmonary effort is normal.     Breath sounds: Normal breath sounds. No wheezing or rhonchi.  Musculoskeletal:     Right lower  leg: No edema.     Left lower leg: No edema.  Neurological:     Mental Status: She is alert and oriented to person, place, and time.  Psychiatric:        Mood and Affect: Mood normal.        Behavior: Behavior normal.     A/P:  1. Uncontrolled type 2 diabetes mellitus with hyperglycemia (HCC) - HgB A1c = 10.5 - pt had been drinking large amounts of juice every other day since last OV. In the past 2 wks, she has stopped this and is only drinking water. Home BS readings have steadily improved in the past 2 wks and pt feels better overall - she is starting regular exercise at [facility] and a yoga class - cont metformin 1093m daily, glipizide 520mdaily - f/u in 2 mo or sooner PRN  2. Primary hypertension - elevated today - pt did not take HCTZ 12.16m43met today - check BP at work 3-4x/wk x 2 wks and then send readings via mychart - if > 140 / > 90, will increase HCTZ from 12.16mg58m 216mg64WOasic metabolic panel  3. Hyperlipidemia, unspecified hyperlipidemia type - on lipitor 10mg78mly - Lipid panel - total and LDL chol improved with addition of lipitor - cont with dietary improvement  4. Screening for tuberculosis - QuantiFERON-TB Gold Plus  5. Anxiety and depression - on prozac 20mg 3my  - would like referral to a different [facility] as she does not feel current is a good fit - Ambulatory referral to Psychology    This visit occurred during the SARS-CoV-2 public health emergency.  Safety protocols were in place, including screening questions prior to the visit, additional usage of staff PPE, and extensive cleaning of exam room while observing appropriate contact time as indicated for  disinfecting solutions.

## 2021-06-13 ENCOUNTER — Encounter: Payer: Self-pay | Admitting: Family Medicine

## 2021-06-14 LAB — QUANTIFERON-TB GOLD PLUS
Mitogen-NIL: >10 IU/mL
NIL: 0.03 IU/mL
QuantiFERON-TB Gold Plus: NEGATIVE
TB1-NIL: 0.08 IU/mL
TB2-NIL: 0.12 IU/mL

## 2021-06-18 ENCOUNTER — Encounter: Payer: Self-pay | Admitting: Family Medicine

## 2021-06-18 ENCOUNTER — Other Ambulatory Visit (HOSPITAL_COMMUNITY)
Admission: RE | Admit: 2021-06-18 | Discharge: 2021-06-18 | Disposition: A | Discharge status: Home / Self Care | Payer: No Typology Code available for payment source | Source: Ambulatory Visit | Attending: Family Medicine | Admitting: Family Medicine

## 2021-06-18 ENCOUNTER — Other Ambulatory Visit: Payer: Self-pay

## 2021-06-18 ENCOUNTER — Encounter: Payer: Self-pay | Admitting: General Practice

## 2021-06-18 ENCOUNTER — Ambulatory Visit: Payer: No Typology Code available for payment source | Admitting: Family Medicine

## 2021-06-18 VITALS — BP 117/80 | HR 92 | Wt 347.0 lb

## 2021-06-18 DIAGNOSIS — E1165 Type 2 diabetes mellitus with hyperglycemia: Secondary | ICD-10-CM

## 2021-06-18 DIAGNOSIS — Z30430 Encounter for insertion of intrauterine contraceptive device: Secondary | ICD-10-CM

## 2021-06-18 DIAGNOSIS — E282 Polycystic ovarian syndrome: Secondary | ICD-10-CM | POA: Diagnosis not present

## 2021-06-18 DIAGNOSIS — Z30432 Encounter for removal of intrauterine contraceptive device: Secondary | ICD-10-CM

## 2021-06-18 DIAGNOSIS — Z01419 Encounter for gynecological examination (general) (routine) without abnormal findings: Secondary | ICD-10-CM | POA: Insufficient documentation

## 2021-06-18 MED ORDER — LEVONORGESTREL 20 MCG/DAY IU IUD
1.0000 | INTRAUTERINE_SYSTEM | Freq: Once | INTRAUTERINE | Status: AC
  Administered 2021-06-18: 1 via INTRAUTERINE

## 2021-06-18 NOTE — Progress Notes (Signed)
GYNECOLOGY ANNUAL PREVENTATIVE CARE ENCOUNTER NOTE  Subjective:   Cynthia Bautista is a 35 y.o. [id number] female here for a routine annual gynecologic exam.  Current complaints: Has thick hirsutism on neck, which has been there for awhile. Prior to IUD, had oligomenorrhea with menorrhagia and dysmenorrhea. No problems with IUD - had it placed 6 years ago. Denies abnormal vaginal bleeding, discharge, pelvic pain, problems with intercourse or other gynecologic concerns.    Gynecologic History No LMP recorded. (Menstrual status: IUD). Patient is not sexually active  Contraception: IUD Last Pap: 2016. Results were: normal Last mammogram: n/a.  Obstetric History OB History  Gravida Para Term Preterm AB Living  2 2 2     2   SAB IAB Ectopic Multiple Live Births          2    # Outcome Date GA Lbr Len/2nd Weight Sex Delivery Anes PTL Lv  2 Term 2010 [redacted]w[redacted]d  F Vag-Spont None N LIV  1 Term 2008 470w0d M  EPI N LIV    Past Medical History:  Diagnosis Date   Allergy    Anxiety    Depression    Diabetes mellitus without complication (HCC)    GERD (gastroesophageal reflux disease)     Past Surgical History:  Procedure Laterality Date   adnoids     tonsil      Current Outpatient Medications on File Prior to Visit  Medication Sig Dispense Refill   albuterol (VENTOLIN HFA) 108 (90 Base) MCG/ACT inhaler INHALE 2 PUFFS INTO THE LUNGS EVERY 4 (FOUR) HOURS AS NEEDED FOR WHEEZING OR SHORTNESS OF BREATH. 18 g 2   atorvastatin (LIPITOR) 10 MG tablet TAKE 1 TABLET (10 MG TOTAL) BY MOUTH DAILY. 90 tablet 3   Blood Glucose Monitoring Suppl (FREESTYLE LITE) w/Device KIT Use as directed up to 4 times daily 1 kit 0   Blood Glucose Monitoring Suppl (PRECISION XTRA) DEVI Check blood glucose daily     FLUoxetine (PROZAC) 20 MG capsule TAKE 1 CAPSULE (20 MG TOTAL) BY MOUTH DAILY. 90 capsule 3   glipiZIDE (GLUCOTROL XL) 5 MG 24 hr tablet TAKE 1 TABLET (5 MG TOTAL) BY MOUTH DAILY WITH BREAKFAST. 90  tablet 3   glucose blood (FREESTYLE LITE) test strip Use as directed up to 4 times daily 200 each 0   glucose blood (ONETOUCH VERIO) test strip Check blood glucose daily     hydrochlorothiazide (MICROZIDE) 12.5 MG capsule TAKE 1 CAPSULE (12.5 MG TOTAL) BY MOUTH DAILY. 90 capsule 3   ibuprofen (ADVIL) 400 MG tablet TAKE 1 TO 2 TABLETS BY MOUTH 3 TIMES DAILY TO 4 TIMES DAILY AS NEEDED FOR THE RELIEF OF PAIN 16 tablet 12   Lancets (FREESTYLE) lancets Use as directed up to 4 times daily 200 each 0   levonorgestrel (MIRENA) 20 MCG/DAY IUD 1 each by Intrauterine route once.     metFORMIN (GLUCOPHAGE-XR) 500 MG 24 hr tablet Take 2 tablets (1,000 mg total) by mouth daily with breakfast. 180 tablet 3   OneTouch Delica Lancets 3368HISC Check blood glucose daily     pantoprazole (PROTONIX) 40 MG tablet TAKE 1 TABLET (40 MG TOTAL) BY MOUTH 2 (TWO) TIMES DAILY. 180 tablet 3   No current facility-administered medications on file prior to visit.    Allergies  Allergen Reactions   Morphine Anxiety    Social History   Socioeconomic History   Marital status: Single    Spouse name: Not on file  Number of children: Not on file   Years of education: Not on file   Highest education level: Not on file  Occupational History   Not on file  Tobacco Use   Smoking status: Never   Smokeless tobacco: Never  Vaping Use   Vaping Use: Never used  Substance and Sexual Activity   Alcohol use: Yes    Comment: occasional   Drug use: Never   Sexual activity: Not Currently  Other Topics Concern   Not on file  Social History Narrative   Not on file   Social Determinants of Health   Financial Resource Strain: Not on file  Food Insecurity: Not on file  Transportation Needs: Not on file  Physical Activity: Not on file  Stress: Not on file  Social Connections: Not on file  Intimate Partner Violence: Not on file    Family History  Problem Relation Age of Onset   Diabetes Mother    Hypertension Mother     Arthritis Mother    Diabetes Father    Hypertension Father    Hyperlipidemia Father    Arthritis Father    Cancer Paternal Grandmother    Kidney disease Paternal Grandmother    Hypertension Paternal Grandmother     The following portions of the patient's history were reviewed and updated as appropriate: allergies, current medications, past family history, past medical history, past social history, past surgical history and problem list.  Review of Systems Pertinent items are noted in HPI.   Objective:  BP 117/80   Pulse 92   Wt (!) 347 lb (157.4 kg)   BMI 59.10 kg/m  Wt Readings from Last 3 Encounters:  06/18/21 (!) 347 lb (157.4 kg)  06/12/21 (!) 341 lb 9.6 oz (154.9 kg)  03/06/21 (!) 348 lb (157.9 kg)     Chaperone present during exam  CONSTITUTIONAL: Well-developed, well-nourished female in no acute distress.  HENT:  Normocephalic, atraumatic, External right and left ear normal. Oropharynx is clear and moist EYES: Conjunctivae and EOM are normal. Pupils are equal, round, and reactive to light. No scleral icterus.  NECK: Normal range of motion, supple, no masses.  Normal thyroid. Grade 2 hirsutism.  CARDIOVASCULAR: Normal heart rate noted, regular rhythm RESPIRATORY: Clear to auscultation bilaterally. Effort and breath sounds normal, no problems with respiration noted. BREASTS: Symmetric in size. No masses, skin changes, nipple drainage, or lymphadenopathy. ABDOMEN: Soft, normal bowel sounds, no distention noted.  No tenderness, rebound or guarding.  PELVIC: Normal appearing external genitalia; normal appearing vaginal mucosa and cervix.  No abnormal discharge noted.   MUSCULOSKELETAL: Normal range of motion. No tenderness.  No cyanosis, clubbing, or edema.  2+ distal pulses. SKIN: Skin is warm and dry. No rash noted. Not diaphoretic. No erythema. No pallor. NEUROLOGIC: Alert and oriented to person, place, and time. Normal reflexes, muscle tone coordination. No cranial  nerve deficit noted. PSYCHIATRIC: Normal mood and affect. Normal behavior. Normal judgment and thought content.   IUD Removal  Patient was in the dorsal lithotomy position, normal external genitalia was noted.  A speculum was placed in the patient's vagina, normal discharge was noted, no lesions. The multiparous cervix was visualized, no lesions, no abnormal discharge,  and was swabbed with Betadine using scopettes.  The strings of the IUD was grasped and pulled using ring forceps.  The IUD was successfully removed in its entirety.  Patient tolerated the procedure well.  IUD Procedure Note Patient identified, informed consent performed, signed copy in chart, time out was  performed.  Urine pregnancy test negative.  Speculum placed in the vagina.  Cervix visualized.  Cleaned with Betadine x 2.  Paracervical block placed with Lidocaine 2% with epinephrine 46m spread between the 12 o'clock, 4 o'clock, 8 o'clock positions. Cervix grasped anteriorly with a single tooth tenaculum.  Uterus sounded to 9 cm.  Mirena  IUD placed per manufacturer's recommendations.  Strings trimmed to 3 cm. Tenaculum was removed, good hemostasis noted.  Patient tolerated procedure well.   Patient given post procedure instructions and Mirena care card with expiration date.  Patient is asked to check IUD strings periodically and follow up in 4-6 weeks for IUD check.      Assessment:  Annual gynecologic examination with pap smear   Plan:  1. Well woman exam with routine gynecological exam PAP done today - will call with results and manage accordingly  2. PCOS (polycystic ovarian syndrome) Discussed management of PCOS - her current symptoms are the hirsutism as her cycles are well controlled with the IUD. I would be hesitant to introduce estrogen to her regimen. Would favor anti-androgen to her regimen instead. Patient not wanting to add at this point. Discussed weight loss as additive to hirsutism.   3. Uncontrolled type  2 diabetes mellitus with hyperglycemia ([place]  4. Encounter for IUD removal 5. Encounter for IUD insertion IUD removed and reinserted as above. Return in 1 month for string check.   Routine preventative health maintenance measures emphasized. Please refer to After Visit Summary for other counseling recommendations.    JLoma Boston DKingston Estatesfor WDe[employer] Company

## 2021-06-18 NOTE — Addendum Note (Signed)
Addended by: Charlsie Quest B on: 06/18/2021 03:29 PM   Modules accepted: Orders

## 2021-06-24 LAB — CYTOLOGY - PAP
Comment: NEGATIVE
Diagnosis: NEGATIVE
High risk HPV: NEGATIVE

## 2021-07-24 ENCOUNTER — Ambulatory Visit: Payer: No Typology Code available for payment source | Admitting: Family Medicine

## 2021-08-25 ENCOUNTER — Other Ambulatory Visit (HOSPITAL_COMMUNITY): Payer: Self-pay

## 2021-08-25 ENCOUNTER — Encounter: Payer: No Typology Code available for payment source | Admitting: Family Medicine

## 2021-08-25 MED FILL — Glipizide Tab ER 24HR 5 MG: ORAL | 90 days supply | Qty: 90 | Fill #0 | Status: AC

## 2021-08-25 MED FILL — Atorvastatin Calcium Tab 10 MG (Base Equivalent): ORAL | 90 days supply | Qty: 90 | Fill #0 | Status: AC

## 2021-09-02 ENCOUNTER — Encounter: Payer: Self-pay | Admitting: Nurse Practitioner

## 2021-09-02 ENCOUNTER — Other Ambulatory Visit: Payer: Self-pay

## 2021-09-02 ENCOUNTER — Ambulatory Visit (INDEPENDENT_AMBULATORY_CARE_PROVIDER_SITE_OTHER): Payer: No Typology Code available for payment source | Admitting: Nurse Practitioner

## 2021-09-02 ENCOUNTER — Other Ambulatory Visit (HOSPITAL_COMMUNITY): Payer: Self-pay

## 2021-09-02 VITALS — BP 126/88 | HR 93 | Temp 98.2°F | Ht 64.25 in | Wt 342.0 lb

## 2021-09-02 DIAGNOSIS — E1165 Type 2 diabetes mellitus with hyperglycemia: Secondary | ICD-10-CM | POA: Diagnosis not present

## 2021-09-02 DIAGNOSIS — Z23 Encounter for immunization: Secondary | ICD-10-CM | POA: Diagnosis not present

## 2021-09-02 DIAGNOSIS — I10 Essential (primary) hypertension: Secondary | ICD-10-CM

## 2021-09-02 DIAGNOSIS — Z794 Long term (current) use of insulin: Secondary | ICD-10-CM | POA: Diagnosis not present

## 2021-09-02 DIAGNOSIS — K219 Gastro-esophageal reflux disease without esophagitis: Secondary | ICD-10-CM

## 2021-09-02 LAB — POCT GLYCOSYLATED HEMOGLOBIN (HGB A1C): Hemoglobin A1C: 7.9 % — AB (ref 4.0–5.6)

## 2021-09-02 MED ORDER — PANTOPRAZOLE SODIUM 40 MG PO TBEC
40.0000 mg | DELAYED_RELEASE_TABLET | Freq: Two times a day (BID) | ORAL | 3 refills | Status: DC
  Filled 2021-09-02 – 2021-09-17 (×2): qty 180, 90d supply, fill #0

## 2021-09-02 MED ORDER — METFORMIN HCL ER 500 MG PO TB24
1000.0000 mg | ORAL_TABLET | Freq: Every day | ORAL | 3 refills | Status: DC
  Filled 2021-09-02 – 2021-09-17 (×2): qty 180, 90d supply, fill #0
  Filled 2021-12-30: qty 180, 90d supply, fill #1

## 2021-09-02 NOTE — Progress Notes (Signed)
Subjective:  Patient ID: Cynthia Bautista, female    DOB: Mar 23, 1986  Age: 35 y.o. MRN: [id number]  CC: Follow-up (F/u DM.  Average BS  80 -155. )  HPI  Type 2 diabetes mellitus (Cynthia Bautista) Improved an HgbA1c at goal with dietary modification and medication compliance hgbA1c at 7.9% Home glucose reading 80-155 (fasting) Maintain current medication doses (glypizide and metformin) LDL at goal with atorvastatin  BP at goal with HCTZ Entered referral to ophthalmology She deferred foot exam to next appt. Agreed to TDAP and pneumovax 20 vaccines today. F/up in 63month  Wt Readings from Last 3 Encounters:  09/02/21 (!) 342 lb (155.1 kg)  06/18/21 (!) 347 lb (157.4 kg)  06/12/21 (!) 341 lb 9.6 oz (154.9 kg)    Depression screen PHQ 2/9 10/24/2020  Decreased Interest 1  Down, Depressed, Hopeless 1  PHQ - 2 Score 2  Altered sleeping 1  Tired, decreased energy 1  Change in appetite 1  Feeling bad or failure about yourself  0  Trouble concentrating 0  Moving slowly or fidgety/restless 0  Suicidal thoughts 0  PHQ-9 Score 5  Difficult doing work/chores Somewhat difficult    GAD 7 : Generalized Anxiety Score 10/24/2020  Nervous, Anxious, on Edge 0  Control/stop worrying 1  Worry too much - different things 1  Trouble relaxing 1  Restless 0  Easily annoyed or irritable 1  Afraid - awful might happen 0  Total GAD 7 Score 4  Anxiety Difficulty Somewhat difficult   Reviewed past Medical, Social and Family history today.  Outpatient Medications Prior to Visit  Medication Sig Dispense Refill   albuterol (VENTOLIN HFA) 108 (90 Base) MCG/ACT inhaler INHALE 2 PUFFS INTO THE LUNGS EVERY 4 (FOUR) HOURS AS NEEDED FOR WHEEZING OR SHORTNESS OF BREATH. 18 g 2   atorvastatin (LIPITOR) 10 MG tablet TAKE 1 TABLET (10 MG TOTAL) BY MOUTH DAILY. 90 tablet 3   Blood Glucose Monitoring Suppl (FREESTYLE LITE) w/Device KIT Use as directed up to 4 times daily 1 kit 0   FLUoxetine (PROZAC) 20 MG capsule  TAKE 1 CAPSULE (20 MG TOTAL) BY MOUTH DAILY. 90 capsule 3   glipiZIDE (GLUCOTROL XL) 5 MG 24 hr tablet TAKE 1 TABLET (5 MG TOTAL) BY MOUTH DAILY WITH BREAKFAST. 90 tablet 3   glucose blood (FREESTYLE LITE) test strip Use as directed up to 4 times daily 200 each 0   hydrochlorothiazide (MICROZIDE) 12.5 MG capsule TAKE 1 CAPSULE (12.5 MG TOTAL) BY MOUTH DAILY. 90 capsule 3   ibuprofen (ADVIL) 400 MG tablet TAKE 1 TO 2 TABLETS BY MOUTH 3 TIMES DAILY TO 4 TIMES DAILY AS NEEDED FOR THE RELIEF OF PAIN 16 tablet 12   Lancets (FREESTYLE) lancets Use as directed up to 4 times daily 200 each 0   levonorgestrel (MIRENA) 20 MCG/DAY IUD 1 each by Intrauterine route once.     metFORMIN (GLUCOPHAGE-XR) 500 MG 24 hr tablet Take 2 tablets (1,000 mg total) by mouth daily with breakfast. 180 tablet 3   pantoprazole (PROTONIX) 40 MG tablet TAKE 1 TABLET (40 MG TOTAL) BY MOUTH 2 (TWO) TIMES DAILY. 180 tablet 3   Blood Glucose Monitoring Suppl (PRECISION XTRA) DEVI Check blood glucose daily     glucose blood (ONETOUCH VERIO) test strip Check blood glucose daily     OneTouch Delica Lancets [id number] Check blood glucose daily     No facility-administered medications prior to visit.    ROS See HPI  Objective:  Pulse 93  Temp 98.2 F (36.8 C) (Temporal)   Ht 5' 4.25" (1.632 m)   Wt (!) 342 lb (155.1 kg)   SpO2 98%   BMI 58.25 kg/m   Physical Exam Constitutional:      Appearance: She is obese.  Cardiovascular:     Rate and Rhythm: Normal rate and regular rhythm.     Pulses: Normal pulses.     Heart sounds: Normal heart sounds.  Pulmonary:     Effort: Pulmonary effort is normal.     Breath sounds: Normal breath sounds.  Musculoskeletal:     Right lower leg: No edema.     Left lower leg: No edema.  Neurological:     Mental Status: She is alert and oriented to person, place, and time.   Assessment & Plan:  This visit occurred during the SARS-CoV-2 public health emergency.  Safety protocols were in  place, including screening questions prior to the visit, additional usage of staff PPE, and extensive cleaning of exam room while observing appropriate contact time as indicated for disinfecting solutions.   Cynthia Bautista was seen today for follow-up.  Diagnoses and all orders for this visit:  Type 2 diabetes mellitus with hyperglycemia, with long-term current use of insulin (HCC) -     POCT HgB A1C -     metFORMIN (GLUCOPHAGE-XR) 500 MG 24 hr tablet; Take 2 tablets (1,000 mg total) by mouth daily with breakfast. -     Ambulatory referral to Ophthalmology  Primary hypertension  Gastroesophageal reflux disease, unspecified whether esophagitis present -     pantoprazole (PROTONIX) 40 MG tablet; Take 1 tablet (40 mg total) by mouth 2 (two) times daily.   Problem List Items Addressed This Visit       Cardiovascular and Mediastinum   Benign essential hypertension     Digestive   Gastroesophageal reflux disease   Relevant Medications   pantoprazole (PROTONIX) 40 MG tablet     Endocrine   Type 2 diabetes mellitus (HCC) - Primary    Improved an HgbA1c at goal with dietary modification and medication compliance hgbA1c at 7.9% Home glucose reading 80-155 (fasting) Maintain current medication doses (glypizide and metformin) LDL at goal with atorvastatin  BP at goal with HCTZ Entered referral to ophthalmology She deferred foot exam to next appt. Agreed to TDAP and pneumovax 20 vaccines today. F/up in 32month      Relevant Medications   metFORMIN (GLUCOPHAGE-XR) 500 MG 24 hr tablet   Other Relevant Orders   POCT HgB A1C (Completed)   Ambulatory referral to Ophthalmology    Follow-up: Return in about 3 months (around 12/02/2021) for DM and HTN, hyperlipidemia, vit. D.(fasting, need foot exam, and labs).  CWilfred Lacy NP

## 2021-09-02 NOTE — Assessment & Plan Note (Addendum)
Improved an HgbA1c at goal with dietary modification and medication compliance hgbA1c at 7.9% Home glucose reading 80-155 (fasting) Maintain current medication doses (glypizide and metformin) LDL at goal with atorvastatin  BP at goal with HCTZ Entered referral to ophthalmology She deferred foot exam to next appt. Agreed to TDAP and pneumovax 20 vaccines today. F/up in 22months

## 2021-09-02 NOTE — Patient Instructions (Addendum)
Maintain current medications F/up in 11months  Start vitamin D 2000IU daily and calcium 600mg  BID.  You will be contacted to schedule appt with ophthalmology.

## 2021-09-04 ENCOUNTER — Ambulatory Visit: Payer: No Typology Code available for payment source | Admitting: Family Medicine

## 2021-09-10 ENCOUNTER — Other Ambulatory Visit (HOSPITAL_COMMUNITY): Payer: Self-pay

## 2021-09-17 ENCOUNTER — Other Ambulatory Visit (HOSPITAL_COMMUNITY): Payer: Self-pay

## 2021-09-24 ENCOUNTER — Ambulatory Visit (INDEPENDENT_AMBULATORY_CARE_PROVIDER_SITE_OTHER): Payer: No Typology Code available for payment source

## 2021-09-24 ENCOUNTER — Encounter: Payer: Self-pay | Admitting: Nurse Practitioner

## 2021-09-24 ENCOUNTER — Other Ambulatory Visit: Payer: Self-pay

## 2021-09-24 DIAGNOSIS — Z23 Encounter for immunization: Secondary | ICD-10-CM | POA: Diagnosis not present

## 2021-09-24 NOTE — Progress Notes (Signed)
After obtaining informed consent, the immunization is given by Thornell Sartorius, RN in the right deltoid IM. Pt tolerated injection well. Pt told to wait in clinic for 20 mins and to report any adverse reactions to me immediately.

## 2021-10-30 ENCOUNTER — Encounter: Payer: No Typology Code available for payment source | Admitting: Nurse Practitioner

## 2021-11-05 ENCOUNTER — Other Ambulatory Visit: Payer: Self-pay

## 2021-11-05 ENCOUNTER — Other Ambulatory Visit (HOSPITAL_COMMUNITY): Payer: Self-pay

## 2021-11-05 ENCOUNTER — Encounter: Payer: Self-pay | Admitting: Nurse Practitioner

## 2021-11-05 ENCOUNTER — Ambulatory Visit (INDEPENDENT_AMBULATORY_CARE_PROVIDER_SITE_OTHER): Payer: No Typology Code available for payment source | Admitting: Nurse Practitioner

## 2021-11-05 VITALS — BP 132/84 | HR 90 | Temp 97.5°F | Resp 18 | Wt 349.6 lb

## 2021-11-05 DIAGNOSIS — F339 Major depressive disorder, recurrent, unspecified: Secondary | ICD-10-CM | POA: Insufficient documentation

## 2021-11-05 DIAGNOSIS — F3341 Major depressive disorder, recurrent, in partial remission: Secondary | ICD-10-CM

## 2021-11-05 DIAGNOSIS — E559 Vitamin D deficiency, unspecified: Secondary | ICD-10-CM | POA: Diagnosis not present

## 2021-11-05 DIAGNOSIS — E1165 Type 2 diabetes mellitus with hyperglycemia: Secondary | ICD-10-CM

## 2021-11-05 DIAGNOSIS — I10 Essential (primary) hypertension: Secondary | ICD-10-CM | POA: Diagnosis not present

## 2021-11-05 LAB — BASIC METABOLIC PANEL
BUN: 9 mg/dL (ref 6–23)
CO2: 28 mEq/L (ref 19–32)
Calcium: 9.3 mg/dL (ref 8.4–10.5)
Chloride: 103 mEq/L (ref 96–112)
Creatinine, Ser: 0.65 mg/dL (ref 0.40–1.20)
GFR: 114.38 mL/min (ref >60.00)
Glucose, Bld: 109 mg/dL — ABNORMAL HIGH (ref 70–99)
Potassium: 3.8 mEq/L (ref 3.5–5.1)
Sodium: 137 mEq/L (ref 135–145)

## 2021-11-05 LAB — HEPATIC FUNCTION PANEL
ALT: 12 U/L (ref 0–35)
AST: 15 U/L (ref 0–37)
Albumin: 4 g/dL (ref 3.5–5.2)
Alkaline Phosphatase: 80 U/L (ref 39–117)
Bilirubin, Direct: 0.1 mg/dL (ref 0.0–0.3)
Total Bilirubin: 0.7 mg/dL (ref 0.2–1.2)
Total Protein: 7.2 g/dL (ref 6.0–8.3)

## 2021-11-05 LAB — MICROALBUMIN / CREATININE URINE RATIO
Creatinine,U: 177.4 mg/dL
Microalb Creat Ratio: 0.7 mg/g (ref 0.0–30.0)
Microalb, Ur: 1.2 mg/dL (ref 0.0–1.9)

## 2021-11-05 LAB — TSH: TSH: 2.5 u[IU]/mL (ref 0.35–5.50)

## 2021-11-05 LAB — VITAMIN D 25 HYDROXY (VIT D DEFICIENCY, FRACTURES): VITD: <7 ng/mL — ABNORMAL LOW (ref 30.00–100.00)

## 2021-11-05 MED ORDER — GLIPIZIDE ER 2.5 MG PO TB24
2.5000 mg | ORAL_TABLET | Freq: Every day | ORAL | 1 refills | Status: DC
  Filled 2021-11-05 – 2022-02-04 (×2): qty 90, 90d supply, fill #0

## 2021-11-05 MED ORDER — OZEMPIC (0.25 OR 0.5 MG/DOSE) 2 MG/1.5ML ~~LOC~~ SOPN
PEN_INJECTOR | SUBCUTANEOUS | 2 refills | Status: DC
  Filled 2021-11-05: qty 1.5, 28d supply, fill #0
  Filled 2021-11-14: qty 1.5, 35d supply, fill #0

## 2021-11-05 NOTE — Assessment & Plan Note (Signed)
Repeat vit. D °

## 2021-11-05 NOTE — Assessment & Plan Note (Signed)
Improving with metformin and glipizide. Fasting glucose 84-140. Advised about importance of incorporating daily exercise. Normal DM foot exam today Advised to schedule appt with ophthalmology.  Decreased glipizide dose to 2.5mg  due to adding ozempic to facility weight loss and improve glucose control Repeat hgbA1c, cmp and urine microalbumin today

## 2021-11-05 NOTE — Assessment & Plan Note (Signed)
Chronic, waxing and waning, stable at this time. previous use of lexapro (ineffective), med switched to fluoxetine(reports increased irritability, so discontinued medication 45month ago) Denies need for another medication at this time. No SI/HI. plans to schedule appt with psychologist.

## 2021-11-05 NOTE — Progress Notes (Signed)
Subjective:  Patient ID: Cynthia Bautista, female    DOB: 1986/04/27  Age: 35 y.o. MRN: [id number]  CC: Follow-up (Weight management pt would like to discuss use of mounjaro or ozempic. )  HPI Morbid obesity ([place]) She is interested in use of ozempic or mounjaro injection to facilitate weight loss and DM control. Hx of DM, PCOS, and HTN Exercise: plans to start doing home exercises. She does not like going to the gym. Diet: struggles with eating late at night, most meals are ordered, eats 783mals per day and snacks in between. Has not participated in weight loss program in past. Declined referral to this time. Wt Readings from Last 3 Encounters:  11/05/21 (!) 349 lb 9.6 oz (158.6 kg)  09/02/21 (!) 342 lb (155.1 kg)  06/18/21 (!) 347 lb (157.4 kg)   We discussed the pros and cons of GLP-1 injection. Advised about the importance of combining medication with lifestyle modifications (decrease portions, high protein and fiber diet, choose healthy snacks, daily exercise). Provided printed information. She verbalized understanding. She choose to start ozempic injection. rx sent. Check CMP and TSH F/up in 1183month Vitamin D deficiency Repeat vit. D  Benign essential hypertension BP at goal with HCTZ BP Readings from Last 3 Encounters:  11/05/21 132/84  09/02/21 126/88  06/18/21 117/80   Check CMP maintain med dose  Type 2 diabetes mellitus ([place] with metformin and glipizide. Fasting glucose 84-140. Advised about importance of incorporating daily exercise. Normal DM foot exam today Advised to schedule appt with ophthalmology.  Decreased glipizide dose to 2.83m12mue to adding ozempic to facility weight loss and improve glucose control Repeat hgbA1c, cmp and urine microalbumin today   Depression, recurrent (HCC) Chronic, waxing and waning, stable at this time. previous use of lexapro (ineffective), med switched to fluoxetine(reports increased irritability, so  discontinued medication 1mo50month) Denies need for another medication at this time. No SI/HI. plans to schedule appt with psychologist.    Reviewed past Medical, Social and Family history today.  Outpatient Medications Prior to Visit  Medication Sig Dispense Refill   albuterol (VENTOLIN HFA) 108 (90 Base) MCG/ACT inhaler INHALE 2 PUFFS INTO THE LUNGS EVERY 4 (FOUR) HOURS AS NEEDED FOR WHEEZING OR SHORTNESS OF BREATH. 18 g 2   atorvastatin (LIPITOR) 10 MG tablet TAKE 1 TABLET (10 MG TOTAL) BY MOUTH DAILY. 90 tablet 3   Blood Glucose Monitoring Suppl (FREESTYLE LITE) w/Device KIT Use as directed up to 4 times daily 1 kit 0   glucose blood (FREESTYLE LITE) test strip Use as directed up to 4 times daily 200 each 0   ibuprofen (ADVIL) 400 MG tablet TAKE 1 TO 2 TABLETS BY MOUTH 3 TIMES DAILY TO 4 TIMES DAILY AS NEEDED FOR THE RELIEF OF PAIN 16 tablet 12   Lancets (FREESTYLE) lancets Use as directed up to 4 times daily 200 each 0   levonorgestrel (MIRENA) 20 MCG/DAY IUD 1 each by Intrauterine route once.     metFORMIN (GLUCOPHAGE-XR) 500 MG 24 hr tablet Take 2 tablets (1,000 mg total) by mouth daily with breakfast. 180 tablet 3   pantoprazole (PROTONIX) 40 MG tablet Take 1 tablet (40 mg total) by mouth 2 (two) times daily. 180 tablet 3   glipiZIDE (GLUCOTROL XL) 5 MG 24 hr tablet TAKE 1 TABLET (5 MG TOTAL) BY MOUTH DAILY WITH BREAKFAST. 90 tablet 3   hydrochlorothiazide (MICROZIDE) 12.5 MG capsule TAKE 1 CAPSULE (12.5 MG TOTAL) BY MOUTH DAILY. 90 capsule 3  FLUoxetine (PROZAC) 20 MG capsule TAKE 1 CAPSULE (20 MG TOTAL) BY MOUTH DAILY. 90 capsule 3   No facility-administered medications prior to visit.    ROS See HPI  Objective:  BP 132/84 (BP Location: Left Arm, Patient Position: Sitting, Cuff Size: Large)   Pulse 90   Temp (!) 97.5 F (36.4 C) (Temporal)   Resp 18   Wt (!) 349 lb 9.6 oz (158.6 kg)   SpO2 98%   BMI 59.54 kg/m   Physical Exam Vitals reviewed.  Constitutional:       Appearance: She is obese.  Cardiovascular:     Rate and Rhythm: Normal rate.     Pulses: Normal pulses.  Pulmonary:     Effort: Pulmonary effort is normal.  Neurological:     Mental Status: She is alert and oriented to person, place, and time.  Psychiatric:        Mood and Affect: Mood normal.        Behavior: Behavior normal.        Thought Content: Thought content normal.    Assessment & Plan:  This visit occurred during the SARS-CoV-2 public health emergency.  Safety protocols were in place, including screening questions prior to the visit, additional usage of staff PPE, and extensive cleaning of exam room while observing appropriate contact time as indicated for disinfecting solutions.   Cynthia Bautista was seen today for follow-up.  Diagnoses and all orders for this visit:  Type 2 diabetes mellitus with hyperglycemia, without long-term current use of insulin (HCC) -     Basic metabolic panel -     Hepatic function panel -     Microalbumin / creatinine urine ratio -     Semaglutide,0.25 or 0.5MG/DOS, (OZEMPIC, 0.25 OR 0.5 MG/DOSE,) 2 MG/1.5ML SOPN; Inject 0.25 mg into the skin once a week for 2 weeks, THEN 0.5 mg once a week for 2 weeks. Then follow up with provider -     glipiZIDE (GLUCOTROL XL) 2.5 MG 24 hr tablet; Take 1 tablet (2.5 mg total) by mouth daily with breakfast.  Benign essential hypertension -     Basic metabolic panel  Vitamin D deficiency -     Vitamin D (25 hydroxy)  Morbid obesity (HCC) -     TSH  Recurrent major depressive disorder, in partial remission ([place])   Problem List Items Addressed This Visit       Cardiovascular and Mediastinum   Benign essential hypertension    BP at goal with HCTZ BP Readings from Last 3 Encounters:  11/05/21 132/84  09/02/21 126/88  06/18/21 117/80   Check CMP maintain med dose      Relevant Orders   Basic metabolic panel     Endocrine   Type 2 diabetes mellitus ([place]) - Primary    Improving with metformin and  glipizide. Fasting glucose 84-140. Advised about importance of incorporating daily exercise. Normal DM foot exam today Advised to schedule appt with ophthalmology.  Decreased glipizide dose to 2.30m due to adding ozempic to facility weight loss and improve glucose control Repeat hgbA1c, cmp and urine microalbumin today       Relevant Medications   Semaglutide,0.25 or 0.5MG/DOS, (OZEMPIC, 0.25 OR 0.5 MG/DOSE,) 2 MG/1.5ML SOPN   glipiZIDE (GLUCOTROL XL) 2.5 MG 24 hr tablet   Other Relevant Orders   Basic metabolic panel   Hepatic function panel   Microalbumin / creatinine urine ratio     Other   Depression, recurrent (HCC)    Chronic, waxing  and waning, stable at this time. previous use of lexapro (ineffective), med switched to fluoxetine(reports increased irritability, so discontinued medication 30monthago) Denies need for another medication at this time. No SI/HI. plans to schedule appt with psychologist.      Morbid obesity ([facility]    She is interested in use of ozempic or mounjaro injection to facilitate weight loss and DM control. Hx of DM, PCOS, and HTN Exercise: plans to start doing home exercises. She does not like going to the gym. Diet: struggles with eating late at night, most meals are ordered, eats 296mls per day and snacks in between. Has not participated in weight loss program in past. Declined referral to this time. Wt Readings from Last 3 Encounters:  11/05/21 (!) 349 lb 9.6 oz (158.6 kg)  09/02/21 (!) 342 lb (155.1 kg)  06/18/21 (!) 347 lb (157.4 kg)   We discussed the pros and cons of GLP-1 injection. Advised about the importance of combining medication with lifestyle modifications (decrease portions, high protein and fiber diet, choose healthy snacks, daily exercise). Provided printed information. She verbalized understanding. She choose to start ozempic injection. rx sent. Check CMP and TSH F/up in 48m23month    Relevant Medications   Semaglutide,0.25 or  0.5MG/DOS, (OZEMPIC, 0.25 OR 0.5 MG/DOSE,) 2 MG/1.5ML SOPN   glipiZIDE (GLUCOTROL XL) 2.5 MG 24 hr tablet   Other Relevant Orders   TSH   Vitamin D deficiency    Repeat vit. D      Relevant Orders   Vitamin D (25 hydroxy)    Follow-up: Return in about 4 weeks (around 12/03/2021) for DM and HTN, weight management.  ChaWilfred LacyP

## 2021-11-05 NOTE — Assessment & Plan Note (Addendum)
She is interested in use of ozempic or mounjaro injection to facilitate weight loss and DM control. Hx of DM, PCOS, and HTN Exercise: plans to start doing home exercises. She does not like going to the gym. Diet: struggles with eating late at night, most meals are ordered, eats per day and snacks in between. Has not participated in weight loss program in past. Declined referral to this time. Wt Readings from Last 3 Encounters:  11/05/21 (!) 349 lb 9.6 oz (158.6 kg)  09/02/21 (!) 342 lb (155.1 kg)  06/18/21 (!) 347 lb (157.4 kg)   We discussed the pros and cons of GLP-1 injection. Advised about the importance of combining medication with lifestyle modifications (decrease portions, high protein and fiber diet, choose healthy snacks, daily exercise). Provided printed information. She verbalized understanding. She choose to start ozempic injection. rx sent. Check CMP and TSH F/up in 47month

## 2021-11-05 NOTE — Assessment & Plan Note (Signed)
BP at goal with HCTZ BP Readings from Last 3 Encounters:  11/05/21 132/84  09/02/21 126/88  06/18/21 117/80   Check CMP maintain med dose

## 2021-11-05 NOTE — Patient Instructions (Addendum)
Start ozempic as discussed Decrease glipizide to 2.5mg  to avoid hypoglycemia. Maintain dietary modifications and start daily exercise. Bring glucose reading to next office visit. Go to lab for urine collection and blood draw  How to Increase Your Level of Physical Activity Getting regular physical activity is important for your overall health and well-being. Most people do not get enough exercise. There are easy ways to increase your level of physical activity, even if you have not been very active in the past or if you are just starting out. What are the benefits of physical activity? Physical activity has many short-term and long-term benefits. Being active on a regular basis can improve your physical and mental health as well as provide other benefits. Physical health benefits Helping you lose weight or maintain a healthy weight. Strengthening your muscles and bones. Reducing your risk of certain long-term (chronic) diseases, including heart disease, cancer, and diabetes. Being able to move around more easily and for longer periods of time without getting tired (increased endurance or stamina). Improving your ability to fight off illness (enhanced immunity). Being able to sleep better. Helping you stay healthy as you get older, including: Helping you stay mobile, or capable of walking and moving around. Preventing accidents, such as falls. Increasing life expectancy. Mental health benefits Boosting your mood and improving your self-esteem. Lowering your chance of having mental health problems, such as depression or anxiety. Helping you feel good about your body. Other benefits Finding new sources of fun and enjoyment. Meeting new people who share a common interest. Before you begin If you have a chronic illness or have not been active for a while, check with your health care provider about how to get started. Ask your health care provider what activities are safe for you. Start out  slowly. Walking or doing some simple chair exercises is a good place to start, especially if you have not been active before or for a long time. Set goals that you can work toward. Ask your health care provider how much exercise is best for you. In general, most adults should: Do moderate-intensity exercise for at least 150 minutes each week (30 minutes on most days of the week) or vigorous exercise for at least 75 minutes each week, or a combination of these. Moderate-intensity exercise can include walking at a quick pace, biking, yoga, water aerobics, or gardening. Vigorous exercise involves activities that take more effort, such as jogging or running, playing sports, swimming laps, or jumping rope. Do strength exercises on at least 2 days each week. This can include weight lifting, body weight exercises, and resistance-band exercises. How to be more physically active Make a plan  Try to find activities that you enjoy. You are more likely to commit to an exercise routine if it does not feel like a chore. If you have bone or joint problems, choose low-impact exercises, like walking or swimming. Use these tips for being successful with an exercise plan: Find a workout partner for accountability. Join a group or class, such as an aerobics class, cycling class, or sports team. Make family time active. Go for a walk, bike, or swim. Include a variety of exercises each week. Consider using a fitness tracker, such as a mobile phone app or a device worn like a watch, that will count the number of steps you take each day. Many people strive to reach 10,000 steps a day. Find ways to be active in your daily routines Besides your formal exercise plans, you can find  ways to do physical activity during your daily routines, such as: Walking or biking to work or to the store. Taking the stairs instead of the elevator. Parking farther away from the door at work or at the store. Planning walking  meetings. Walking around while you are on the phone. Where to find more information Centers for Disease Control and Prevention: CampusCasting.com.pt President's Council on Fitness, Sports & Nutrition: www.fitness.gov ChooseMyPlate: http://www.harvey.com/ Contact a health care provider if: You have headaches, muscle aches, or joint pain that is concerning. You feel dizzy or light-headed while exercising. You faint. You feel your heart skipping, racing, or fluttering. You have chest pain while exercising. Summary Exercise benefits your mind and body at any age, even if you are just starting out. If you have a chronic illness or have not been active for a while, check with your health care provider before increasing your physical activity. Choose activities that are safe and enjoyable for you. Ask your health care provider what activities are safe for you. Start slowly. Tell your health care provider if you have problems as you start to increase your activity level. This information is not intended to replace advice given to you by your health care provider. Make sure you discuss any questions you have with your health care provider. Document Revised: 03/28/2021 Document Reviewed: 03/28/2021 Elsevier Patient Education  2022 [employer]vier Inc.  Calorie Counting for [employer] Calories are units of energy. Your body needs a certain number of calories from food to keep going throughout the day. When you eat or drink more calories than your body needs, your body stores the extra calories mostly as fat. When you eat or drink fewer calories than your body needs, your body burns fat to get the energy it needs. Calorie counting means keeping track of how many calories you eat and drink each day. Calorie counting can be helpful if you need to lose weight. If you eat fewer calories than your body needs, you should lose weight. Ask your health care provider what a healthy weight is for you. For calorie  counting to work, you will need to eat the right number of calories each day to lose a healthy amount of weight per week. A dietitian can help you figure out how many calories you need in a day and will suggest ways to reach your calorie goal. A healthy amount of weight to lose each week is usually 1-2 lb (0.5-0.9 kg). This usually means that your daily calorie intake should be reduced by 500-750 calories. Eating 1,200-1,500 calories a day can help most women lose weight. Eating 1,500-1,800 calories a day can help most men lose weight. What do I need to know about calorie counting? Work with your health care provider or dietitian to determine how many calories you should get each day. To meet your daily calorie goal, you will need to: Find out how many calories are in each food that you would like to eat. Try to do this before you eat. Decide how much of the food you plan to eat. Keep a food log. Do this by writing down what you ate and how many calories it had. To successfully lose weight, it is important to balance calorie counting with a healthy lifestyle that includes regular activity. Where do I find calorie information? The number of calories in a food can be found on a Nutrition Facts label. If a food does not have a Nutrition Facts label, try to look up the calories  online or ask your dietitian for help. Remember that calories are listed per serving. If you choose to have more than one serving of a food, you will have to multiply the calories per serving by the number of servings you plan to eat. For example, the label on a package of bread might say that a serving size is 1 slice and that there are 90 calories in a serving. If you eat 1 slice, you will have eaten 90 calories. If you eat 2 slices, you will have eaten 180 calories. How do I keep a food log? After each time that you eat, record the following in your food log as soon as possible: What you ate. Be sure to include toppings, sauces,  and other extras on the food. How much you ate. This can be measured in cups, ounces, or number of items. How many calories were in each food and drink. The total number of calories in the food you ate. Keep your food log near you, such as in a pocket-sized notebook or on an app or website on your mobile phone. Some programs will calculate calories for you and show you how many calories you have left to meet your daily goal. What are some portion-control tips? Know how many calories are in a serving. This will help you know how many servings you can have of a certain food. Use a measuring cup to measure serving sizes. You could also try weighing out portions on a kitchen scale. With time, you will be able to estimate serving sizes for some foods. Take time to put servings of different foods on your favorite plates or in your favorite bowls and cups so you know what a serving looks like. Try not to eat straight from a food's packaging, such as from a bag or box. Eating straight from the package makes it hard to see how much you are eating and can lead to overeating. Put the amount you would like to eat in a cup or on a plate to make sure you are eating the right portion. Use smaller plates, glasses, and bowls for smaller portions and to prevent overeating. Try not to multitask. For example, avoid watching TV or using your computer while eating. If it is time to eat, sit down at a table and enjoy your food. This will help you recognize when you are full. It will also help you be more mindful of what and how much you are eating. What are tips for following this plan? Reading food labels Check the calorie count compared with the serving size. The serving size may be smaller than what you are used to eating. Check the source of the calories. Try to choose foods that are high in protein, fiber, and vitamins, and low in saturated fat, trans fat, and sodium. Shopping Read nutrition labels while you shop.  This will help you make healthy decisions about which foods to buy. Pay attention to nutrition labels for low-fat or fat-free foods. These foods sometimes have the same number of calories or more calories than the full-fat versions. They also often have added sugar, starch, or salt to make up for flavor that was removed with the fat. Make a grocery list of lower-calorie foods and stick to it. Cooking Try to cook your favorite foods in a healthier way. For example, try baking instead of frying. Use low-fat dairy products. Meal planning Use more fruits and vegetables. One-half of your plate should be fruits and vegetables. Include  lean proteins, such as chicken, [place], and fish. Lifestyle Each week, aim to do one of the following: 150 minutes of moderate exercise, such as walking. 75 minutes of vigorous exercise, such as running. General information Know how many calories are in the foods you eat most often. This will help you calculate calorie counts faster. Find a way of tracking calories that works for you. Get creative. Try different apps or programs if writing down calories does not work for you. What foods should I eat?  Eat nutritious foods. It is better to have a nutritious, high-calorie food, such as an avocado, than a food with few nutrients, such as a bag of potato chips. Use your calories on foods and drinks that will fill you up and will not leave you hungry soon after eating. Examples of foods that fill you up are nuts and nut butters, vegetables, lean proteins, and high-fiber foods such as whole grains. High-fiber foods are foods with more than 5 g of fiber per serving. Pay attention to calories in drinks. Low-calorie drinks include water and unsweetened drinks. The items listed above may not be a complete list of foods and beverages you can eat. Contact a dietitian for more information. What foods should I limit? Limit foods or drinks that are not good sources of vitamins,  minerals, or protein or that are high in unhealthy fats. These include: Candy. Other sweets. Sodas, specialty coffee drinks, alcohol, and juice. The items listed above may not be a complete list of foods and beverages you should avoid. Contact a dietitian for more information. How do I count calories when eating out? Pay attention to portions. Often, portions are much larger when eating out. Try these tips to keep portions smaller: Consider sharing a meal instead of getting your own. If you get your own meal, eat only half of it. Before you start eating, ask for a container and put half of your meal into it. When available, consider ordering smaller portions from the menu instead of full portions. Pay attention to your food and drink choices. Knowing the way food is cooked and what is included with the meal can help you eat fewer calories. If calories are listed on the menu, choose the lower-calorie options. Choose dishes that include vegetables, fruits, whole grains, low-fat dairy products, and lean proteins. Choose items that are boiled, broiled, grilled, or steamed. Avoid items that are buttered, battered, fried, or served with cream sauce. Items labeled as crispy are usually fried, unless stated otherwise. Choose water, low-fat milk, unsweetened iced tea, or other drinks without added sugar. If you want an alcoholic beverage, choose a lower-calorie option, such as a glass of wine or light beer. Ask for dressings, sauces, and syrups on the side. These are usually high in calories, so you should limit the amount you eat. If you want a salad, choose a garden salad and ask for grilled meats. Avoid extra toppings such as bacon, cheese, or fried items. Ask for the dressing on the side, or ask for olive oil and vinegar or lemon to use as dressing. Estimate how many servings of a food you are given. Knowing serving sizes will help you be aware of how much food you are eating at restaurants. Where to  find more information Centers for Disease Control and Prevention: FootballExhibition.com.br U.S. Department of Agriculture: WrestlingReporter.dk Summary Calorie counting means keeping track of how many calories you eat and drink each day. If you eat fewer calories than your body needs, you  should lose weight. A healthy amount of weight to lose per week is usually 1-2 lb (0.5-0.9 kg). This usually means reducing your daily calorie intake by 500-750 calories. The number of calories in a food can be found on a Nutrition Facts label. If a food does not have a Nutrition Facts label, try to look up the calories online or ask your dietitian for help. Use smaller plates, glasses, and bowls for smaller portions and to prevent overeating. Use your calories on foods and drinks that will fill you up and not leave you hungry shortly after a meal. This information is not intended to replace advice given to you by your health care provider. Make sure you discuss any questions you have with your health care provider. Document Revised: 01/11/2020 Document Reviewed: 01/11/2020 Elsevier Patient Education  2022 [employer].

## 2021-11-09 MED ORDER — VITAMIN D (ERGOCALCIFEROL) 1.25 MG (50000 UNIT) PO CAPS
50000.0000 [IU] | ORAL_CAPSULE | ORAL | 0 refills | Status: DC
  Filled 2021-11-09 – 2021-12-04 (×2): qty 12, 84d supply, fill #0

## 2021-11-09 NOTE — Addendum Note (Signed)
Addended by: Alysia Penna L on: 11/09/2021 06:00 PM   Modules accepted: Orders

## 2021-11-10 ENCOUNTER — Other Ambulatory Visit (HOSPITAL_COMMUNITY): Payer: Self-pay

## 2021-11-13 ENCOUNTER — Other Ambulatory Visit (HOSPITAL_COMMUNITY): Payer: Self-pay

## 2021-11-14 ENCOUNTER — Other Ambulatory Visit (HOSPITAL_COMMUNITY): Payer: Self-pay

## 2021-11-14 ENCOUNTER — Other Ambulatory Visit: Payer: Self-pay | Admitting: Family Medicine

## 2021-11-14 DIAGNOSIS — I10 Essential (primary) hypertension: Secondary | ICD-10-CM

## 2021-11-14 MED ORDER — HYDROCHLOROTHIAZIDE 12.5 MG PO CAPS
12.5000 mg | ORAL_CAPSULE | Freq: Every day | ORAL | 1 refills | Status: AC
  Filled 2021-11-14: qty 90, 90d supply, fill #0
  Filled 2022-03-10: qty 90, 90d supply, fill #1

## 2021-11-17 ENCOUNTER — Other Ambulatory Visit (HOSPITAL_COMMUNITY): Payer: Self-pay

## 2021-11-18 ENCOUNTER — Other Ambulatory Visit (HOSPITAL_COMMUNITY): Payer: Self-pay

## 2021-11-27 ENCOUNTER — Encounter: Payer: No Typology Code available for payment source | Admitting: Nurse Practitioner

## 2021-12-04 ENCOUNTER — Other Ambulatory Visit (HOSPITAL_COMMUNITY): Payer: Self-pay

## 2021-12-12 ENCOUNTER — Other Ambulatory Visit (HOSPITAL_COMMUNITY): Payer: Self-pay

## 2021-12-19 ENCOUNTER — Other Ambulatory Visit: Payer: Self-pay

## 2021-12-19 ENCOUNTER — Other Ambulatory Visit (HOSPITAL_COMMUNITY): Payer: Self-pay

## 2021-12-19 ENCOUNTER — Encounter: Payer: Self-pay | Admitting: Nurse Practitioner

## 2021-12-19 ENCOUNTER — Other Ambulatory Visit: Payer: Self-pay | Admitting: Nurse Practitioner

## 2021-12-19 ENCOUNTER — Ambulatory Visit: Payer: 59 | Admitting: Nurse Practitioner

## 2021-12-19 VITALS — BP 110/74 | HR 98 | Temp 98.7°F | Ht 64.25 in | Wt 340.2 lb

## 2021-12-19 DIAGNOSIS — E1165 Type 2 diabetes mellitus with hyperglycemia: Secondary | ICD-10-CM | POA: Diagnosis not present

## 2021-12-19 DIAGNOSIS — E559 Vitamin D deficiency, unspecified: Secondary | ICD-10-CM | POA: Diagnosis not present

## 2021-12-19 LAB — POCT GLYCOSYLATED HEMOGLOBIN (HGB A1C)
HbA1c POC (<> result, manual entry): 7.2 % (ref 4.0–5.6)
HbA1c, POC (controlled diabetic range): 7.2 % — AB (ref 0.0–7.0)
HbA1c, POC (prediabetic range): 7.2 % — AB (ref 5.7–6.4)
Hemoglobin A1C: 7.2 % — AB (ref 4.0–5.6)

## 2021-12-19 MED ORDER — SEMAGLUTIDE (2 MG/DOSE) 8 MG/3ML ~~LOC~~ SOPN
2.0000 mg | PEN_INJECTOR | SUBCUTANEOUS | 0 refills | Status: DC
  Filled 2021-12-19: qty 3, fill #0

## 2021-12-19 MED ORDER — OZEMPIC (1 MG/DOSE) 4 MG/3ML ~~LOC~~ SOPN
1.0000 mg | PEN_INJECTOR | SUBCUTANEOUS | 0 refills | Status: DC
  Filled 2021-12-19 – 2021-12-30 (×2): qty 3, 28d supply, fill #0

## 2021-12-19 MED ORDER — OZEMPIC (0.25 OR 0.5 MG/DOSE) 2 MG/1.5ML ~~LOC~~ SOPN
PEN_INJECTOR | SUBCUTANEOUS | 2 refills | Status: DC
  Filled 2021-12-19: qty 1.5, 14d supply, fill #0

## 2021-12-19 MED ORDER — OZEMPIC (1 MG/DOSE) 4 MG/3ML ~~LOC~~ SOPN
PEN_INJECTOR | SUBCUTANEOUS | 1 refills | Status: DC
  Filled 2021-12-19: qty 3, 28d supply, fill #0

## 2021-12-19 MED ORDER — VITAMIN D (ERGOCALCIFEROL) 1.25 MG (50000 UNIT) PO CAPS
50000.0000 [IU] | ORAL_CAPSULE | ORAL | 1 refills | Status: DC
  Filled 2021-12-19: qty 12, 84d supply, fill #0

## 2021-12-19 NOTE — Patient Instructions (Addendum)
Cynthia Bautista made or [employer] or prenatal multivitamin. Start vitamin D supplement Schedule eye appt Increase ozempic dose as discussed Start daily exercise (cardio and weight training ) Maintain high fiber and high protein diet. F/up in 25month

## 2021-12-19 NOTE — Assessment & Plan Note (Signed)
Controlled with home glucose 94-128 Current use of ozempic, glipizide and metformin Advised to schedule appt for DM eye exam.  POCT hgbA1c today: 7.2% Maintain current medications Repeat in 22months

## 2021-12-19 NOTE — Assessment & Plan Note (Signed)
Lost 9lbs in last 2.17months with ozempic Denies any adverse side effects. Has no implemented an regular exercise regimen due to work-life balance Has decrease meal portions. Does not calculate daily calorie intake. Wt Readings from Last 3 Encounters:  12/19/21 (!) 340 lb 3.2 oz (154.3 kg)  11/05/21 (!) 349 lb 9.6 oz (158.6 kg)  09/02/21 (!) 342 lb (155.1 kg)   Increase ozempic dose to 0.75mg , then 1mg  , then 1.5mg  F/up in 56month Advised to Start daily exercise (cardio 2month and weight training ) Maintain high fiber and high protein diet.

## 2021-12-19 NOTE — Assessment & Plan Note (Signed)
Did not start supplement as prescribed. Advised about the importance of the replacing vitamin D. Also advised to start MVI 1tab daily

## 2021-12-19 NOTE — Addendum Note (Signed)
Addended by: Wilfred Lacy L on: 12/19/2021 03:06 PM   Modules accepted: Orders

## 2021-12-19 NOTE — Addendum Note (Signed)
Addended by: Willaim Bane on: 12/19/2021 03:02 PM   Modules accepted: Orders

## 2021-12-19 NOTE — Progress Notes (Signed)
Subjective:  Patient ID: Cynthia Bautista, female    DOB: 11-03-1986  Age: 36 y.o. MRN: [id number]  CC: Transitions Of Care (TOC. DM f/u. BS range from 94-128)  HPI  Vitamin D deficiency Did not start supplement as prescribed. Advised about the importance of the replacing vitamin D. Also advised to start MVI 1tab daily  Type 2 diabetes mellitus ([place]) Controlled with home glucose 94-128 Current use of ozempic, glipizide and metformin Advised to schedule appt for DM eye exam.  POCT hgbA1c today: 7.2% Maintain current medications Repeat in 60month  Morbid obesity ([place] Lost 9lbs in last 2.549monthwith ozempic Denies any adverse side effects. Has no implemented an regular exercise regimen due to work-life balance Has decrease meal portions. Does not calculate daily calorie intake. Wt Readings from Last 3 Encounters:  12/19/21 (!) 340 lb 3.2 oz (154.3 kg)  11/05/21 (!) 349 lb 9.6 oz (158.6 kg)  09/02/21 (!) 342 lb (155.1 kg)   Increase ozempic dose to 0.7597mthen 1mg82mthen 1.5mg 47mp in 1mont53monthed to Start daily exercise (cardio 20min 51mweight training 20mins)27mntain high fiber and high protein diet.  Wt Readings from Last 3 Encounters:  12/19/21 (!) 340 lb 3.2 oz (154.3 kg)  11/05/21 (!) 349 lb 9.6 oz (158.6 kg)  09/02/21 (!) 342 lb (155.1 kg)    Reviewed past Medical, Social and Family history today.  Outpatient Medications Prior to Visit  Medication Sig Dispense Refill   albuterol (VENTOLIN HFA) 108 (90 Base) MCG/ACT inhaler INHALE 2 PUFFS INTO THE LUNGS EVERY 4 (FOUR) HOURS AS NEEDED FOR WHEEZING OR SHORTNESS OF BREATH. 18 g 2   atorvastatin (LIPITOR) 10 MG tablet TAKE 1 TABLET (10 MG TOTAL) BY MOUTH DAILY. 90 tablet 3   Blood Glucose Monitoring Suppl (FREESTYLE LITE) w/Device KIT Use as directed up to 4 times daily 1 kit 0   glipiZIDE (GLUCOTROL XL) 2.5 MG 24 hr tablet Take 1 tablet (2.5 mg total) by mouth daily with breakfast. 90 tablet 1   glucose blood  (FREESTYLE LITE) test strip Use as directed up to 4 times daily 200 each 0   hydrochlorothiazide (MICROZIDE) 12.5 MG capsule Take 1 capsule (12.5 mg total) by mouth daily. 90 capsule 1   ibuprofen (ADVIL) 400 MG tablet TAKE 1 TO 2 TABLETS BY MOUTH 3 TIMES DAILY TO 4 TIMES DAILY AS NEEDED FOR THE RELIEF OF PAIN 16 tablet 12   Lancets (FREESTYLE) lancets Use as directed up to 4 times daily 200 each 0   levonorgestrel (MIRENA) 20 MCG/DAY IUD 1 each by Intrauterine route once.     metFORMIN (GLUCOPHAGE-XR) 500 MG 24 hr tablet Take 2 tablets (1,000 mg total) by mouth daily with breakfast. 180 tablet 3   pantoprazole (PROTONIX) 40 MG tablet Take 1 tablet (40 mg total) by mouth 2 (two) times daily. 180 tablet 3   Semaglutide,0.25 or 0.5MG/DOS, (OZEMPIC, 0.25 OR 0.5 MG/DOSE,) 2 MG/1.5ML SOPN Inject 0.25 mg into the skin once a week for 2 weeks, THEN 0.5 mg once a week for 2 weeks. Then follow up with provider 1.5 mL 2   Vitamin D, Ergocalciferol, (DRISDOL) 1.25 MG (50000 UNIT) CAPS capsule Take 1 capsule (50,000 Units total) by mouth every 7 (seven) days. 12 capsule 0   No facility-administered medications prior to visit.    ROS See HPI  Objective:  BP 110/74 (BP Location: Left Arm, Patient Position: Sitting, Cuff Size: Large)    Pulse 98    Temp  98.7 F (37.1 C) (Temporal)    Ht 5' 4.25" (1.632 m)    Wt (!) 340 lb 3.2 oz (154.3 kg)    SpO2 96%    BMI 57.94 kg/m   Physical Exam Constitutional:      Appearance: She is obese.  Cardiovascular:     Rate and Rhythm: Normal rate.     Pulses: Normal pulses.  Pulmonary:     Effort: Pulmonary effort is normal.  Neurological:     Mental Status: She is alert and oriented to person, place, and time.  Psychiatric:        Mood and Affect: Mood normal.        Behavior: Behavior normal.        Thought Content: Thought content normal.    Assessment & Plan:  This visit occurred during the SARS-CoV-2 public health emergency.  Safety protocols were in  place, including screening questions prior to the visit, additional usage of staff PPE, and extensive cleaning of exam room while observing appropriate contact time as indicated for disinfecting solutions.   Cynthia Bautista was seen today for transitions of care.  Diagnoses and all orders for this visit:  Vitamin D deficiency -     Vitamin D, Ergocalciferol, (DRISDOL) 1.25 MG (50000 UNIT) CAPS capsule; Take 1 capsule (50,000 Units total) by mouth every 7 (seven) days.  Type 2 diabetes mellitus with hyperglycemia, without long-term current use of insulin (HCC) -     Semaglutide,0.25 or 0.5MG/DOS, (OZEMPIC, 0.25 OR 0.5 MG/DOSE,) 2 MG/1.5ML SOPN; Inject 0.75 mg into the skin once a week for 7 days, THEN 1 mg once a week for 7 days, THEN 1.5 mg once a week for 14 days. -     POCT glycosylated hemoglobin (Hb A1C)  Morbid obesity (HCC)   Problem List Items Addressed This Visit       Endocrine   Type 2 diabetes mellitus ([place])    Controlled with home glucose 94-128 Current use of ozempic, glipizide and metformin Advised to schedule appt for DM eye exam.  POCT hgbA1c today: 7.2% Maintain current medications Repeat in 75month      Relevant Medications   Semaglutide,0.25 or 0.5MG/DOS, (OZEMPIC, 0.25 OR 0.5 MG/DOSE,) 2 MG/1.5ML SOPN   Other Relevant Orders   POCT glycosylated hemoglobin (Hb A1C) (Completed)     Other   Morbid obesity ([place]    Lost 9lbs in last 2.528monthwith ozempic Denies any adverse side effects. Has no implemented an regular exercise regimen due to work-life balance Has decrease meal portions. Does not calculate daily calorie intake. Wt Readings from Last 3 Encounters:  12/19/21 (!) 340 lb 3.2 oz (154.3 kg)  11/05/21 (!) 349 lb 9.6 oz (158.6 kg)  09/02/21 (!) 342 lb (155.1 kg)   Increase ozempic dose to 0.7528mthen 1mg1mthen 1.5mg 30mp in 1mont7monthed to Start daily exercise (cardio 20min 5mweight training 20mins)19mntain high fiber and high protein diet.       Relevant Medications   Semaglutide,0.25 or 0.5MG/DOS, (OZEMPIC, 0.25 OR 0.5 MG/DOSE,) 2 MG/1.5ML SOPN   Vitamin D deficiency - Primary    Did not start supplement as prescribed. Advised about the importance of the replacing vitamin D. Also advised to start MVI 1tab daily      Relevant Medications   Vitamin D, Ergocalciferol, (DRISDOL) 1.25 MG (50000 UNIT) CAPS capsule    Follow-up: Return in about 4 weeks (around 01/16/2022) for DM and weight management.  Cynthia Bautista

## 2021-12-22 ENCOUNTER — Other Ambulatory Visit (HOSPITAL_COMMUNITY): Payer: Self-pay

## 2021-12-30 ENCOUNTER — Other Ambulatory Visit (HOSPITAL_COMMUNITY): Payer: Self-pay

## 2021-12-31 ENCOUNTER — Encounter: Payer: Self-pay | Admitting: Nurse Practitioner

## 2021-12-31 DIAGNOSIS — E1165 Type 2 diabetes mellitus with hyperglycemia: Secondary | ICD-10-CM

## 2022-01-19 ENCOUNTER — Encounter (INDEPENDENT_AMBULATORY_CARE_PROVIDER_SITE_OTHER): Payer: 59 | Admitting: Nurse Practitioner

## 2022-01-19 DIAGNOSIS — E1165 Type 2 diabetes mellitus with hyperglycemia: Secondary | ICD-10-CM

## 2022-01-19 DIAGNOSIS — R112 Nausea with vomiting, unspecified: Secondary | ICD-10-CM | POA: Diagnosis not present

## 2022-01-20 ENCOUNTER — Other Ambulatory Visit (HOSPITAL_COMMUNITY): Payer: Self-pay

## 2022-01-20 MED ORDER — ONDANSETRON HCL 4 MG PO TABS
4.0000 mg | ORAL_TABLET | Freq: Three times a day (TID) | ORAL | 0 refills | Status: DC | PRN
  Filled 2022-01-20 – 2022-02-04 (×2): qty 21, 7d supply, fill #0

## 2022-01-20 NOTE — Telephone Encounter (Signed)
Please see the MyChart message reply(ies) for my assessment and plan.  The patient gave consent for this Medical Advice Message and is aware that it may result in a bill to their insurance company as well as the possibility that this may result in a co-payment or deductible. They are an established patient, but are not seeking medical advice exclusively about a problem treated during an in person or video visit in the last 7 days. I did not recommend an in person or video visit within 7 days of my reply.  Cumulative time spent within 7 days through [employer] 15 minutes  Alysia Penna, NP

## 2022-01-20 NOTE — Assessment & Plan Note (Signed)
Developed nausea and vomiting with ozempic 1mg  weekly x 2weeks.  Denies any ABD pain or constipation.  Decrease dose to 0.5mg  weekly,  zofran rx sent Schedule f/up appt if no improvement

## 2022-01-27 ENCOUNTER — Ambulatory Visit: Payer: 59 | Admitting: Nurse Practitioner

## 2022-01-28 ENCOUNTER — Other Ambulatory Visit (HOSPITAL_COMMUNITY): Payer: Self-pay

## 2022-02-04 ENCOUNTER — Other Ambulatory Visit: Payer: Self-pay | Admitting: Nurse Practitioner

## 2022-02-04 ENCOUNTER — Other Ambulatory Visit (HOSPITAL_COMMUNITY): Payer: Self-pay

## 2022-02-04 DIAGNOSIS — E1165 Type 2 diabetes mellitus with hyperglycemia: Secondary | ICD-10-CM

## 2022-02-04 MED ORDER — OZEMPIC (1 MG/DOSE) 4 MG/3ML ~~LOC~~ SOPN
1.0000 mg | PEN_INJECTOR | SUBCUTANEOUS | 0 refills | Status: DC
  Filled 2022-02-04: qty 3, 28d supply, fill #0

## 2022-02-05 ENCOUNTER — Other Ambulatory Visit (HOSPITAL_COMMUNITY): Payer: Self-pay

## 2022-03-09 ENCOUNTER — Other Ambulatory Visit (HOSPITAL_COMMUNITY): Payer: Self-pay

## 2022-03-10 ENCOUNTER — Other Ambulatory Visit (HOSPITAL_COMMUNITY): Payer: Self-pay

## 2022-03-10 ENCOUNTER — Other Ambulatory Visit: Payer: Self-pay

## 2022-03-10 ENCOUNTER — Telehealth: Payer: 59 | Admitting: Nurse Practitioner

## 2022-03-10 ENCOUNTER — Encounter: Payer: Self-pay | Admitting: Nurse Practitioner

## 2022-03-10 VITALS — BP 124/88 | HR 88 | Temp 98.3°F | Ht 64.25 in | Wt 338.0 lb

## 2022-03-10 DIAGNOSIS — E559 Vitamin D deficiency, unspecified: Secondary | ICD-10-CM | POA: Diagnosis not present

## 2022-03-10 DIAGNOSIS — I10 Essential (primary) hypertension: Secondary | ICD-10-CM

## 2022-03-10 DIAGNOSIS — E785 Hyperlipidemia, unspecified: Secondary | ICD-10-CM

## 2022-03-10 DIAGNOSIS — K2100 Gastro-esophageal reflux disease with esophagitis, without bleeding: Secondary | ICD-10-CM

## 2022-03-10 DIAGNOSIS — F3341 Major depressive disorder, recurrent, in partial remission: Secondary | ICD-10-CM

## 2022-03-10 DIAGNOSIS — E1169 Type 2 diabetes mellitus with other specified complication: Secondary | ICD-10-CM | POA: Insufficient documentation

## 2022-03-10 DIAGNOSIS — R1013 Epigastric pain: Secondary | ICD-10-CM

## 2022-03-10 MED ORDER — METFORMIN HCL ER (MOD) 1000 MG PO TB24
1000.0000 mg | ORAL_TABLET | Freq: Every day | ORAL | 3 refills | Status: DC
  Filled 2022-03-10: qty 90, 90d supply, fill #0

## 2022-03-10 MED ORDER — OZEMPIC (1 MG/DOSE) 4 MG/3ML ~~LOC~~ SOPN
1.0000 mg | PEN_INJECTOR | SUBCUTANEOUS | 0 refills | Status: DC
  Filled 2022-03-10: qty 3, 28d supply, fill #0

## 2022-03-10 MED ORDER — METFORMIN HCL ER 500 MG PO TB24
1000.0000 mg | ORAL_TABLET | Freq: Every day | ORAL | 3 refills | Status: AC
  Filled 2022-03-10: qty 180, 90d supply, fill #0

## 2022-03-10 MED ORDER — VITAMIN D3 125 MCG (5000 UT) PO CAPS: 5000.0000 [IU] | ORAL_CAPSULE | Freq: Every day | ORAL | Status: AC

## 2022-03-10 MED ORDER — ONDANSETRON HCL 4 MG PO TABS
4.0000 mg | ORAL_TABLET | Freq: Three times a day (TID) | ORAL | 0 refills | Status: DC | PRN
  Filled 2022-03-10: qty 30, 10d supply, fill #0

## 2022-03-10 MED ORDER — ATORVASTATIN CALCIUM 10 MG PO TABS
10.0000 mg | ORAL_TABLET | Freq: Every day | ORAL | 3 refills | Status: DC
  Filled 2022-03-10: qty 90, 90d supply, fill #0

## 2022-03-10 MED ORDER — PANTOPRAZOLE SODIUM 40 MG PO TBEC
40.0000 mg | DELAYED_RELEASE_TABLET | Freq: Every day | ORAL | 3 refills | Status: AC
Start: 2022-03-10 — End: ?
  Filled 2022-03-10: qty 90, 90d supply, fill #0

## 2022-03-10 MED ORDER — OZEMPIC (2 MG/DOSE) 8 MG/3ML ~~LOC~~ SOPN
2.0000 mg | PEN_INJECTOR | SUBCUTANEOUS | 5 refills | Status: DC
  Filled 2022-03-10: qty 3, 28d supply, fill #0

## 2022-03-10 NOTE — Assessment & Plan Note (Signed)
unable to tolerate 50000IU weekly. Caused constipation. She switched to 5000IU daily and had no adverse side effects. ?Repeat Vit. D today ?Maintain OTC dose ?

## 2022-03-10 NOTE — Assessment & Plan Note (Signed)
Normal urine microalbumin. ?Up to date with eye exam ?Home glucose fasting: 78-128. ?hypoglycemic symptoms: hunger. ?Current use of metformin with breakfast, glipizide with supper, and ozempic once a week.  ?No constipation, no diarrhea ?Intermittent nausea and ABD pain, some improvement with PPI and zofran. ? ?Check ABD [place], CMP, and lipase ?d/c glipizide ?Maintain metformin and ozempic dose. ?F/up in 1-3months ? ?

## 2022-03-10 NOTE — Assessment & Plan Note (Signed)
Note from previous appt 10/2021: "Chronic, waxing and waning, stable at this time. previous use of lexapro (ineffective), med switched to fluoxetine(reports increased irritability, so discontinued medication 61month ago) ?Denies need for another medication at this time. No SI/HI. plans to schedule appt with psychologist." ? ?Reports increased anxiety due to stress at home(single parent of 2 teenagers, no assistance from their father, and working fulltime) ?We discussed use of zoloft vs cymbalta ?Denies need for medication at this time ?Did not schedule appt with therapist due upcoming move to her hometown. ?No SI/HI ?

## 2022-03-10 NOTE — Assessment & Plan Note (Addendum)
Lost 2lbs in last 31months ?Lost total of 10lbs in last 41months ?Use of ozzempic 1mg  week ?Has not made any dietary modification. No exercise regimen due to work schedule. ?Wt Readings from Last 3 Encounters:  ?03/10/22 (!) 338 lb (153.3 kg)  ?12/19/21 (!) 340 lb 3.2 oz (154.3 kg)  ?11/05/21 (!) 349 lb 9.6 oz (158.6 kg)  ? ?

## 2022-03-10 NOTE — Assessment & Plan Note (Addendum)
Waxing and waning with pantoprazole and zofran ?Associated with nausea ? ?Advised about the importance of low fat diet ?Check lipase, ABD [place] and CMP ?Maintain current med dose ?Consider referral to GI if normal labs and ABD/US ?

## 2022-03-10 NOTE — Assessment & Plan Note (Signed)
Current use of atorvastatin ?No adverse side effects ?Repeat lipid panel ?

## 2022-03-10 NOTE — Progress Notes (Signed)
Virtual Visit via Video Note ? ?I connected withNAME@ on 03/10/22 at  1:20 PM EDT by a video enabled telemedicine application and verified that I am speaking with the correct person using two identifiers. ? ?Location: ?Patient:Home ?Provider: Office ?Participants: patient and provider ? ?I discussed the limitations of evaluation and management by telemedicine and the availability of in person appointments. ?I also discussed with the patient that there may be a patient responsible charge related to this service. The patient expressed understanding and agreed to proceed. ? ?CC:DM, HTN, hyperlipidemia and weight management ? ?History of Present Illness: ?Recurrent major depressive disorder, in partial remission (HCC) ?Note from previous appt 10/2021: "Chronic, waxing and waning, stable at this time. previous use of lexapro (ineffective), med switched to fluoxetine(reports increased irritability, so discontinued medication 73month ago) ?Denies need for another medication at this time. No SI/HI. plans to schedule appt with psychologist." ? ?Reports increased anxiety due to stress at home(single parent of 2 teenagers, no assistance from their father, and working fulltime) ?We discussed use of zoloft vs cymbalta ?Denies need for medication at this time ?Did not schedule appt with therapist due upcoming move to her hometown. ?No SI/HI ? ?Vitamin D deficiency ?unable to tolerate 50000IU weekly. Caused constipation. She switched to 5000IU daily and had no adverse side effects. ?Repeat Vit. D today ?Maintain OTC dose ? ?Hyperlipidemia associated with type 2 diabetes mellitus (HCC) ?Current use of atorvastatin ?No adverse side effects ?Repeat lipid panel ? ?Benign essential hypertension ?BP at goal with HCTZ ?BP Readings from Last 3 Encounters:  ?03/10/22 124/88  ?12/19/21 110/74  ?11/05/21 132/84  ? ?Repeat CMP ?Maintain med dose ? ?Type 2 diabetes mellitus (HCC) ?Normal urine microalbumin. ?Up to date with eye exam ?Home  glucose fasting: 78-128. ?hypoglycemic symptoms: hunger. ?Current use of metformin with breakfast, glipizide with supper, and ozempic once a week.  ?No constipation, no diarrhea ?Intermittent nausea and ABD pain, some improvement with PPI and zofran. ? ?Check ABD [place], CMP, and lipase ?d/c glipizide ?Maintain metformin and ozempic dose. ?F/up in 1-52months ? ? ?Gastroesophageal reflux disease ?Waxing and waning with pantoprazole and zofran ?Associated with nausea ? ?Advised about the importance of low fat diet ?Check lipase, ABD [place] and CMP ?Maintain current med dose ?Consider referral to GI if normal labs and ABD/US ? ?Morbid obesity (HCC) ?Lost 2lbs in last 11months ?Lost total of 10lbs in last 66months ?Use of ozzempic 1mg  week ?Has not made any dietary modification. No exercise regimen due to work schedule. ?Wt Readings from Last 3 Encounters:  ?03/10/22 (!) 338 lb (153.3 kg)  ?12/19/21 (!) 340 lb 3.2 oz (154.3 kg)  ?11/05/21 (!) 349 lb 9.6 oz (158.6 kg)  ?  ? ?  03/10/2022  ? 11:29 AM 11/05/2021  ? 11:33 AM 10/24/2020  ?  1:49 PM  ?GAD 7 : Generalized Anxiety Score  ?Nervous, Anxious, on Edge 2 0 0  ?Control/stop worrying 3 1 1   ?Worry too much - different things 3 2 1   ?Trouble relaxing 2 1 1   ?Restless 0 0 0  ?Easily annoyed or irritable 1 3 1   ?Afraid - awful might happen 2 0 0  ?Total GAD 7 Score 13 7 4   ?Anxiety Difficulty Very difficult Somewhat difficult Somewhat difficult  ? ? ?  03/10/2022  ? 11:28 AM 11/05/2021  ? 11:33 AM 10/24/2020  ?  1:47 PM  ?Depression screen PHQ 2/9  ?Decreased Interest 0 0 1  ?Down, Depressed, Hopeless 1 1 1   ?PHQ -  2 Score 1 1 2   ?Altered sleeping 1 2 1   ?Tired, decreased energy 0 1 1  ?Change in appetite 0 3 1  ?Feeling bad or failure about yourself  1 1 0  ?Trouble concentrating 0 0 0  ?Moving slowly or fidgety/restless 0 0 0  ?Suicidal thoughts 0 0 0  ?PHQ-9 Score 3 8 5   ?Difficult doing work/chores Somewhat difficult Not difficult at all Somewhat difficult  ?    ?Observations/Objective: ?Alert and oriented x 4 ?Limited exam due to video appt. ? ?Assessment and Plan: ? was seen today for follow-up. ? ?Diagnoses and all orders for this visit: ? ?Type 2 diabetes mellitus with other specified complication, without long-term current use of insulin (HCC) ?-     Comprehensive metabolic panel; Future ?-     atorvastatin (LIPITOR) 10 MG tablet; Take 1 tablet (10 mg total) by mouth daily. ?-     Discontinue: Semaglutide, 2 MG/DOSE, (OZEMPIC, 2 MG/DOSE,) 8 MG/3ML SOPN; Inject 2 mg into the skin once a week. ?-     Discontinue: metFORMIN (GLUMETZA) 1000 MG (MOD) 24 hr tablet; Take 1 tablet (1,000 mg total) by mouth daily with breakfast. ?-     metFORMIN (GLUCOPHAGE-XR) 500 MG 24 hr tablet; Take 2 tablets (1,000 mg total) by mouth daily with breakfast. ?-     Semaglutide, 1 MG/DOSE, (OZEMPIC, 1 MG/DOSE,) 4 MG/3ML SOPN; Inject 1 mg into the skin once a week. ?-     Lipase; Future ? ?Vitamin D deficiency ?-     Vitamin D (25 hydroxy); Future ?-     Cholecalciferol (VITAMIN D3) 125 MCG (5000 UT) CAPS; Take 1 capsule (5,000 Units total) by mouth daily. ? ?Hyperlipidemia associated with type 2 diabetes mellitus (HCC) ?-     Comprehensive metabolic panel; Future ?-     Lipid panel; Future ?-     atorvastatin (LIPITOR) 10 MG tablet; Take 1 tablet (10 mg total) by mouth daily. ? ?Recurrent major depressive disorder, in partial remission (HCC) ? ?Gastroesophageal reflux disease with esophagitis without hemorrhage ?-     pantoprazole (PROTONIX) 40 MG tablet; Take 1 tablet (40 mg total) by mouth daily. ?-     ondansetron (ZOFRAN) 4 MG tablet; Take 1 tablet (4 mg total) by mouth every 8 (eight) hours as needed for nausea or vomiting. ? ?Morbid obesity (HCC) ?-     Discontinue: Semaglutide, 2 MG/DOSE, (OZEMPIC, 2 MG/DOSE,) 8 MG/3ML SOPN; Inject 2 mg into the skin once a week. ?-     Semaglutide, 1 MG/DOSE, (OZEMPIC, 1 MG/DOSE,) 4 MG/3ML SOPN; Inject 1 mg into the skin once a week. ? ?Benign  essential hypertension ? ?Epigastric pain ?-     Abdomen Limited RUQ (LIVER/GB); Future ?-     Lipase; Future ? ? ? ?Follow Up Instructions: ?See instructions above ?  ?I discussed the assessment and treatment plan with the patient. The patient was provided an opportunity to ask questions and all were answered. The patient agreed with the plan and demonstrated an understanding of the instructions. ?  ?The patient was advised to call back or seek an in-person evaluation if the symptoms worsen or if the condition fails to improve as anticipated. ? ? , NP  ?

## 2022-03-10 NOTE — Assessment & Plan Note (Signed)
BP at goal with HCTZ ?BP Readings from Last 3 Encounters:  ?03/10/22 124/88  ?12/19/21 110/74  ?11/05/21 132/84  ? ?Repeat CMP ?Maintain med dose ?

## 2022-03-11 ENCOUNTER — Other Ambulatory Visit (INDEPENDENT_AMBULATORY_CARE_PROVIDER_SITE_OTHER): Payer: 59

## 2022-03-11 DIAGNOSIS — E1169 Type 2 diabetes mellitus with other specified complication: Secondary | ICD-10-CM | POA: Diagnosis not present

## 2022-03-11 DIAGNOSIS — E559 Vitamin D deficiency, unspecified: Secondary | ICD-10-CM

## 2022-03-11 DIAGNOSIS — R1013 Epigastric pain: Secondary | ICD-10-CM

## 2022-03-11 DIAGNOSIS — E785 Hyperlipidemia, unspecified: Secondary | ICD-10-CM | POA: Diagnosis not present

## 2022-03-11 LAB — COMPREHENSIVE METABOLIC PANEL WITH GFR
ALT: 12 U/L (ref 0–35)
AST: 13 U/L (ref 0–37)
Albumin: 3.9 g/dL (ref 3.5–5.2)
Alkaline Phosphatase: 65 U/L (ref 39–117)
BUN: 12 mg/dL (ref 6–23)
CO2: 28 meq/L (ref 19–32)
Calcium: 9.2 mg/dL (ref 8.4–10.5)
Chloride: 105 meq/L (ref 96–112)
Creatinine, Ser: 0.67 mg/dL (ref 0.40–1.20)
GFR: 113.27 mL/min
Glucose, Bld: 113 mg/dL — ABNORMAL HIGH (ref 70–99)
Potassium: 4.2 meq/L (ref 3.5–5.1)
Sodium: 138 meq/L (ref 135–145)
Total Bilirubin: 0.4 mg/dL (ref 0.2–1.2)
Total Protein: 6.8 g/dL (ref 6.0–8.3)

## 2022-03-11 LAB — LIPID PANEL
Cholesterol: 194 mg/dL (ref 0–200)
HDL: 39.7 mg/dL (ref >39.00)
LDL Cholesterol: 138 mg/dL — ABNORMAL HIGH (ref 0–99)
NonHDL: 154.39
Total CHOL/HDL Ratio: 5
Triglycerides: 84 mg/dL (ref 0.0–149.0)
VLDL: 16.8 mg/dL (ref 0.0–40.0)

## 2022-03-11 LAB — VITAMIN D 25 HYDROXY (VIT D DEFICIENCY, FRACTURES): VITD: 18.35 ng/mL — ABNORMAL LOW (ref 30.00–100.00)

## 2022-03-11 LAB — LIPASE: Lipase: 16 U/L (ref 11.0–59.0)

## 2022-03-11 NOTE — Progress Notes (Signed)
Per the orders of  Alysia Penna NP, pt is here for fasting labs, pt tolerated draw well. ? ?

## 2022-03-12 ENCOUNTER — Other Ambulatory Visit (HOSPITAL_COMMUNITY): Payer: Self-pay

## 2022-03-12 ENCOUNTER — Encounter: Payer: Self-pay | Admitting: Nurse Practitioner

## 2022-03-12 ENCOUNTER — Other Ambulatory Visit: Payer: Self-pay | Admitting: Nurse Practitioner

## 2022-03-12 DIAGNOSIS — E1169 Type 2 diabetes mellitus with other specified complication: Secondary | ICD-10-CM

## 2022-03-12 MED ORDER — ATORVASTATIN CALCIUM 20 MG PO TABS
20.0000 mg | ORAL_TABLET | Freq: Every day | ORAL | 1 refills | Status: DC
  Filled 2022-03-12: qty 90, 90d supply, fill #0

## 2022-03-16 ENCOUNTER — Ambulatory Visit
Admission: RE | Admit: 2022-03-16 | Discharge: 2022-03-16 | Disposition: A | Discharge status: Home / Self Care | Payer: 59 | Source: Ambulatory Visit | Attending: Nurse Practitioner | Admitting: Nurse Practitioner

## 2022-03-16 DIAGNOSIS — R1013 Epigastric pain: Secondary | ICD-10-CM

## 2022-03-20 ENCOUNTER — Other Ambulatory Visit (HOSPITAL_COMMUNITY): Payer: Self-pay

## 2022-04-02 ENCOUNTER — Other Ambulatory Visit (HOSPITAL_COMMUNITY): Payer: Self-pay

## 2022-04-02 ENCOUNTER — Telehealth: Payer: Self-pay

## 2022-04-02 DIAGNOSIS — F3341 Major depressive disorder, recurrent, in partial remission: Secondary | ICD-10-CM

## 2022-04-02 MED ORDER — DULOXETINE HCL 20 MG PO CPEP
20.0000 mg | ORAL_CAPSULE | Freq: Every day | ORAL | 5 refills | Status: DC
  Filled 2022-04-02: qty 30, 30d supply, fill #0

## 2022-04-02 NOTE — Telephone Encounter (Signed)
Pt called stating that at last OV, it was discussed that if needed the Rx Cymbalta could be sent in. Pt is requesting at this time that this medication be sent in to her pharmacy of choice.  ? ?Please advise. ?

## 2022-04-10 ENCOUNTER — Ambulatory Visit: Payer: 59 | Admitting: Nurse Practitioner

## 2022-04-10 ENCOUNTER — Other Ambulatory Visit (HOSPITAL_COMMUNITY): Payer: Self-pay

## 2022-04-15 ENCOUNTER — Other Ambulatory Visit: Payer: Self-pay | Admitting: Nurse Practitioner

## 2022-04-15 ENCOUNTER — Other Ambulatory Visit (HOSPITAL_COMMUNITY): Payer: Self-pay

## 2022-04-15 DIAGNOSIS — E1169 Type 2 diabetes mellitus with other specified complication: Secondary | ICD-10-CM

## 2022-04-15 MED ORDER — OZEMPIC (2 MG/DOSE) 8 MG/3ML ~~LOC~~ SOPN
2.0000 mg | PEN_INJECTOR | SUBCUTANEOUS | 5 refills | Status: AC
  Filled 2022-04-15: qty 3, 28d supply, fill #0

## 2022-04-21 ENCOUNTER — Other Ambulatory Visit (HOSPITAL_COMMUNITY): Payer: Self-pay

## 2022-04-30 ENCOUNTER — Other Ambulatory Visit (HOSPITAL_COMMUNITY): Payer: Self-pay

## 2022-04-30 ENCOUNTER — Ambulatory Visit: Payer: 59 | Admitting: Nurse Practitioner

## 2022-04-30 ENCOUNTER — Encounter: Payer: Self-pay | Admitting: Nurse Practitioner

## 2022-04-30 DIAGNOSIS — F3341 Major depressive disorder, recurrent, in partial remission: Secondary | ICD-10-CM

## 2022-04-30 DIAGNOSIS — E785 Hyperlipidemia, unspecified: Secondary | ICD-10-CM | POA: Diagnosis not present

## 2022-04-30 DIAGNOSIS — E1169 Type 2 diabetes mellitus with other specified complication: Secondary | ICD-10-CM | POA: Diagnosis not present

## 2022-04-30 DIAGNOSIS — K2100 Gastro-esophageal reflux disease with esophagitis, without bleeding: Secondary | ICD-10-CM

## 2022-04-30 LAB — POCT GLYCOSYLATED HEMOGLOBIN (HGB A1C): Hemoglobin A1C: 6.6 % — AB (ref 4.0–5.6)

## 2022-04-30 MED ORDER — SUCRALFATE 1 G PO TABS
1.0000 g | ORAL_TABLET | Freq: Three times a day (TID) | ORAL | 0 refills | Status: AC
  Filled 2022-04-30: qty 28, 7d supply, fill #0

## 2022-04-30 MED ORDER — ONDANSETRON HCL 4 MG PO TABS
4.0000 mg | ORAL_TABLET | Freq: Three times a day (TID) | ORAL | 0 refills | Status: AC | PRN
  Filled 2022-04-30: qty 90, 30d supply, fill #0

## 2022-04-30 MED ORDER — ATORVASTATIN CALCIUM 20 MG PO TABS
20.0000 mg | ORAL_TABLET | ORAL | 1 refills | Status: AC
  Filled 2022-04-30: qty 12, 42d supply, fill #0

## 2022-04-30 NOTE — Patient Instructions (Addendum)
Hold metformin till Tuesday. If nausea improves, let me know If it does not, resume metformin Maintain ozempic dose. Take carafate x 1week. Schedule appt with GI Have eye exam report faxed to me.  Try atorvastatin 2x/week. if no muscle cramps maintain med dose.  Diabetes Mellitus and Nutrition, Adult When you have diabetes, or diabetes mellitus, it is very important to have healthy eating habits because your blood sugar (glucose) levels are greatly affected by what you eat and drink. Eating healthy foods in the right amounts, at about the same times every day, can help you: Manage your blood glucose. Lower your risk of heart disease. Improve your blood pressure. Reach or maintain a healthy weight. What can affect my meal plan? Every person with diabetes is different, and each person has different needs for a meal plan. Your health care provider may recommend that you work with a dietitian to make a meal plan that is best for you. Your meal plan may vary depending on factors such as: The calories you need. The medicines you take. Your weight. Your blood glucose, blood pressure, and cholesterol levels. Your activity level. Other health conditions you have, such as heart or kidney disease. How do carbohydrates affect me? Carbohydrates, also called carbs, affect your blood glucose level more than any other type of food. Eating carbs raises the amount of glucose in your blood. It is important to know how many carbs you can safely have in each meal. This is different for every person. Your dietitian can help you calculate how many carbs you should have at each meal and for each snack. How does alcohol affect me? Alcohol can cause a decrease in blood glucose (hypoglycemia), especially if you use insulin or take certain diabetes medicines by mouth. Hypoglycemia can be a life-threatening condition. Symptoms of hypoglycemia, such as sleepiness, dizziness, and confusion, are similar to symptoms of  having too much alcohol. Do not drink alcohol if: Your health care provider tells you not to drink. You are pregnant, may be pregnant, or are planning to become pregnant. If you drink alcohol: Limit how much you have to: 0-1 drink a day for women. 0-2 drinks a day for men. Know how much alcohol is in your drink. In the [place] one drink equals one 12 oz bottle of beer (355 mL), one 5 oz glass of wine (148 mL), or one 1 oz glass of hard liquor (44 mL). Keep yourself hydrated with water, diet soda, or unsweetened iced tea. Keep in mind that regular soda, juice, and other mixers may contain a lot of sugar and must be counted as carbs. What are tips for following this plan?  Reading food labels Start by checking the serving size on the Nutrition Facts label of packaged foods and drinks. The number of calories and the amount of carbs, fats, and other nutrients listed on the label are based on one serving of the item. Many items contain more than one serving per package. Check the total grams (g) of carbs in one serving. Check the number of grams of saturated fats and trans fats in one serving. Choose foods that have a low amount or none of these fats. Check the number of milligrams (mg) of salt (sodium) in one serving. Most people should limit total sodium intake to less than 2,300 mg per day. Always check the nutrition information of foods labeled as "low-fat" or "nonfat." These foods may be higher in added sugar or refined carbs and should be avoided. Talk to  your dietitian to identify your daily goals for nutrients listed on the label. Shopping Avoid buying canned, pre-made, or processed foods. These foods tend to be high in fat, sodium, and added sugar. Shop around the outside edge of the grocery store. This is where you will most often find fresh fruits and vegetables, bulk grains, fresh meats, and fresh dairy products. Cooking Use low-heat cooking methods, such as baking, instead of high-heat  cooking methods, such as deep frying. Cook using healthy oils, such as olive, canola, or sunflower oil. Avoid cooking with butter, cream, or high-fat meats. Meal planning Eat meals and snacks regularly, preferably at the same times every day. Avoid going long periods of time without eating. Eat foods that are high in fiber, such as fresh fruits, vegetables, beans, and whole grains. Eat 4-6 oz (112-168 g) of lean protein each day, such as lean meat, chicken, fish, eggs, or tofu. One ounce (oz) (28 g) of lean protein is equal to: 1 oz (28 g) of meat, chicken, or fish. 1 egg.  cup (62 g) of tofu. Eat some foods each day that contain healthy fats, such as avocado, nuts, seeds, and fish. What foods should I eat? Fruits Berries. Apples. Oranges. Peaches. Apricots. Plums. Grapes. Mangoes. Papayas. Pomegranates. Kiwi. Cherries. Vegetables Leafy greens, including lettuce, spinach, kale, chard, collard greens, mustard greens, and cabbage. Beets. Cauliflower. Broccoli. Carrots. Green beans. Tomatoes. Peppers. Onions. Cucumbers. Brussels sprouts. Grains Whole grains, such as whole-wheat or whole-grain bread, crackers, tortillas, cereal, and pasta. Unsweetened oatmeal. Quinoa. Brown or wild rice. Meats and other proteins Seafood. Poultry without skin. Lean cuts of poultry and beef. Tofu. Nuts. Seeds. Dairy Low-fat or fat-free dairy products such as milk, yogurt, and cheese. The items listed above may not be a complete list of foods and beverages you can eat and drink. Contact a dietitian for more information. What foods should I avoid? Fruits Fruits canned with syrup. Vegetables Canned vegetables. Frozen vegetables with butter or cream sauce. Grains Refined white flour and flour products such as bread, pasta, snack foods, and cereals. Avoid all processed foods. Meats and other proteins Fatty cuts of meat. Poultry with skin. Breaded or fried meats. Processed meat. Avoid saturated  fats. Dairy Full-fat yogurt, cheese, or milk. Beverages Sweetened drinks, such as soda or iced tea. The items listed above may not be a complete list of foods and beverages you should avoid. Contact a dietitian for more information. Questions to ask a health care provider Do I need to meet with a certified diabetes care and education specialist? Do I need to meet with a dietitian? What number can I call if I have questions? When are the best times to check my blood glucose? Where to find more information: American Diabetes Association: diabetes.org Academy of Nutrition and Dietetics: eatright[employer] of Diabetes and Digestive and Kidney Diseases: StageSync.si Association of Diabetes Care & Education Specialists: diabeteseducator.org Summary It is important to have healthy eating habits because your blood sugar (glucose) levels are greatly affected by what you eat and drink. It is important to use alcohol carefully. A healthy meal plan will help you manage your blood glucose and lower your risk of heart disease. Your health care provider may recommend that you work with a dietitian to make a meal plan that is best for you. This information is not intended to replace advice given to you by your health care provider. Make sure you discuss any questions you have with your health care provider. Document Revised: 07/03/2020  Document Reviewed: 07/03/2020 Elsevier Patient Education  2023 [employer].

## 2022-04-30 NOTE — Assessment & Plan Note (Addendum)
Worsening, daily nausea and intermittent vomiting despite use of zofran and pantoprazole 40mg  BID. No weight loss, no melena, no anemia. Normal ABD . Recommended ref to GI but she will be relocating to [facility], so she plans to establish with one there.  Check stool H-pylori Decrease pantoprazole to 40mg  daily and add carafate x 1week. Continue zofran prn

## 2022-04-30 NOTE — Assessment & Plan Note (Signed)
Unable to tolerate atorvastatin 20mg  daily after 3doses. Advised to try 1tab 2x/week.

## 2022-04-30 NOTE — Progress Notes (Signed)
              Established Patient Visit  Patient: Cynthia Bautista   DOB: 03/07/1986   36 y.o. Female  MRN: [id number] Visit Date: 04/30/2022  Subjective:    Chief Complaint  Patient presents with   Follow-up    Follow up on DM. Blood sugar numbers 95-120's. Patient is nonfasting. Patient would like a refill on Zofran. She stopped atorvastatin due to muscle aches. Never started cymbalta. Patient is aware she is due for diabetic eye exam and will complete before the end of the month.   HPI Recurrent major depressive disorder, in partial remission (HCC) Stable mood Declined to take cymbalta Plans to schedule appt with psychology Did not prescribed wellbutrin due to severe GERD symptoms  Gastroesophageal reflux disease Worsening, daily nausea and intermittent vomiting despite use of zofran and pantoprazole 40mg BID. No weight loss, no melena, no anemia. Normal ABD US. Recommended ref to GI but she will be relocating to [place], so she plans to establish with one there.  Check stool H-pylori Decrease pantoprazole to 40mg daily and add carafate x 1week. Continue zofran prn  Type 2 diabetes mellitus (HCC) Controlled with metformin and ozempic Persistent nausea and GERD exacerbation. Has upcoming appt for DM eye exam.  Today's hgbA1c at 6.6% Hold metformin to see if this helps with GI symptoms Continue ozempic. F/up in 3months with new pcp in   Unable to tolerate statin: muscles cramps  Wt Readings from Last 3 Encounters:  04/30/22 (!) 337 lb (152.9 kg)  03/10/22 (!) 338 lb (153.3 kg)  12/19/21 (!) 340 lb 3.2 oz (154.3 kg)       03/10/2022   11:28 AM 11/05/2021   11:33 AM 10/24/2020    1:47 PM  Depression screen PHQ 2/9  Decreased Interest 0 0 1  Down, Depressed, Hopeless 1 1 1  PHQ - 2 Score 1 1 2  Altered sleeping 1 2 1  Tired, decreased energy 0 1 1  Change in appetite 0 3 1  Feeling bad or failure about yourself  1 1 0  Trouble concentrating 0 0 0  Moving slowly or  fidgety/restless 0 0 0  Suicidal thoughts 0 0 0  PHQ-9 Score 3 8 5  Difficult doing work/chores Somewhat difficult Not difficult at all Somewhat difficult       03/10/2022   11:29 AM 11/05/2021   11:33 AM 10/24/2020    1:49 PM  GAD 7 : Generalized Anxiety Score  Nervous, Anxious, on Edge 2 0 0  Control/stop worrying 3 1 1  Worry too much - different things 3 2 1  Trouble relaxing 2 1 1  Restless 0 0 0  Easily annoyed or irritable 1 3 1  Afraid - awful might happen 2 0 0  Total GAD 7 Score 13 7 4  Anxiety Difficulty Very difficult Somewhat difficult Somewhat difficult   Reviewed medical, surgical, and social history today  Medications: Outpatient Medications Prior to Visit  Medication Sig   Blood Glucose Monitoring Suppl (FREESTYLE LITE) w/Device KIT Use as directed up to 4 times daily   Cholecalciferol (VITAMIN D3) 125 MCG (5000 UT) CAPS Take 1 capsule (5,000 Units total) by mouth daily.   glucose blood (FREESTYLE LITE) test strip Use as directed up to 4 times daily   hydrochlorothiazide (MICROZIDE) 12.5 MG capsule Take 1 capsule (12.5 mg total) by mouth daily.   Lancets (FREESTYLE) lancets Use as directed up to 4 times daily     levonorgestrel (MIRENA) 20 MCG/DAY IUD 1 each by Intrauterine route once.   metFORMIN (GLUCOPHAGE-XR) 500 MG 24 hr tablet Take 2 tablets (1,000 mg total) by mouth daily with breakfast.   pantoprazole (PROTONIX) 40 MG tablet Take 1 tablet (40 mg total) by mouth daily.   Semaglutide, 2 MG/DOSE, (OZEMPIC, 2 MG/DOSE,) 8 MG/3ML SOPN Inject 2 mg into the skin once a week.   [DISCONTINUED] ondansetron (ZOFRAN) 4 MG tablet Take 1 tablet (4 mg total) by mouth every 8 (eight) hours as needed for nausea or vomiting.   albuterol (VENTOLIN HFA) 108 (90 Base) MCG/ACT inhaler INHALE 2 PUFFS INTO THE LUNGS EVERY 4 (FOUR) HOURS AS NEEDED FOR WHEEZING OR SHORTNESS OF BREATH.   [DISCONTINUED] atorvastatin (LIPITOR) 20 MG tablet Take 1 tablet (20 mg total) by mouth daily.  (Patient not taking: Reported on 04/30/2022)   [DISCONTINUED] DULoxetine (CYMBALTA) 20 MG capsule Take 1 capsule (20 mg total) by mouth daily. (Patient not taking: Reported on 04/30/2022)   No facility-administered medications prior to visit.   Reviewed past medical and social history.   ROS per HPI above      Objective:  BP 126/82 (BP Location: Left Arm, Patient Position: Sitting, Cuff Size: Large)   Pulse 87   Temp 98 F (36.7 C) (Oral)   Wt (!) 337 lb (152.9 kg)   SpO2 98%   BMI 57.40 kg/m      Physical Exam Neck:     Thyroid: No thyroid mass, thyromegaly or thyroid tenderness.  Cardiovascular:     Rate and Rhythm: Normal rate and regular rhythm.     Pulses: Normal pulses.     Heart sounds: Normal heart sounds.  Pulmonary:     Effort: Pulmonary effort is normal.     Breath sounds: Normal breath sounds.  Musculoskeletal:     Cervical back: Normal range of motion and neck supple.     Right lower leg: No edema.     Left lower leg: No edema.  Lymphadenopathy:     Cervical: No cervical adenopathy.  Neurological:     Mental Status: She is alert and oriented to person, place, and time.    Results for orders placed or performed in visit on 04/30/22  POCT HgB A1C  Result Value Ref Range   Hemoglobin A1C 6.6 (A) 4.0 - 5.6 %   HbA1c POC (<> result, manual entry)     HbA1c, POC (prediabetic range)     HbA1c, POC (controlled diabetic range)        Assessment & Plan:    Problem List Items Addressed This Visit       Digestive   Gastroesophageal reflux disease    Worsening, daily nausea and intermittent vomiting despite use of zofran and pantoprazole 40mg BID. No weight loss, no melena, no anemia. Normal ABD US. Recommended ref to GI but she will be relocating to [place], so she plans to establish with one there.  Check stool H-pylori Decrease pantoprazole to 40mg daily and add carafate x 1week. Continue zofran prn       Relevant Medications   ondansetron (ZOFRAN) 4  MG tablet   sucralfate (CARAFATE) 1 g tablet   Other Relevant Orders   Helicobacter pylori special antigen     Endocrine   Hyperlipidemia associated with type 2 diabetes mellitus (HCC)   Relevant Medications   atorvastatin (LIPITOR) 20 MG tablet   Type 2 diabetes mellitus (HCC)    Controlled with metformin and ozempic Persistent nausea and GERD exacerbation.   Has upcoming appt for DM eye exam.  Today's hgbA1c at 6.6% Hold metformin to see if this helps with GI symptoms Continue ozempic. F/up in 3months with new pcp in [place]       Relevant Medications   atorvastatin (LIPITOR) 20 MG tablet   Other Relevant Orders   POCT HgB A1C (Completed)     Other   Recurrent major depressive disorder, in partial remission (HCC)    Stable mood Declined to take cymbalta Plans to schedule appt with psychology Did not prescribed wellbutrin due to severe GERD symptoms       Return in about 3 months (around 07/31/2022) for DM and HTN, hyperlipidemia.     Charlotte Nche, NP   

## 2022-04-30 NOTE — Assessment & Plan Note (Signed)
Controlled with metformin and ozempic Persistent nausea and GERD exacerbation. Has upcoming appt for DM eye exam.  Today's hgbA1c at 6.6% Hold metformin to see if this helps with GI symptoms Continue ozempic. F/up in 53months with new pcp in [facility]

## 2022-04-30 NOTE — Assessment & Plan Note (Signed)
Stable mood Declined to take cymbalta Plans to schedule appt with psychology Did not prescribed wellbutrin due to severe GERD symptoms

## 2022-05-01 ENCOUNTER — Ambulatory Visit: Payer: 59 | Admitting: Nurse Practitioner

## 2022-05-04 ENCOUNTER — Other Ambulatory Visit: Payer: Self-pay | Admitting: Nurse Practitioner

## 2022-05-04 ENCOUNTER — Other Ambulatory Visit (HOSPITAL_COMMUNITY): Payer: Self-pay

## 2022-05-04 DIAGNOSIS — F3341 Major depressive disorder, recurrent, in partial remission: Secondary | ICD-10-CM

## 2022-05-04 MED ORDER — BUPROPION HCL 75 MG PO TABS
75.0000 mg | ORAL_TABLET | Freq: Two times a day (BID) | ORAL | 5 refills | Status: AC
  Filled 2022-05-04: qty 60, 30d supply, fill #0

## 2022-05-04 NOTE — Assessment & Plan Note (Signed)
She opted to try wellbutrin to manage emotional eating while waiting for appt with therapist.  Sent wellbutrin 75mg  daily x 1week, then 75mg  BID continuously F/up in 102month with me or new pcp

## 2022-09-24 ENCOUNTER — Telehealth: Payer: Self-pay

## 2022-09-25 NOTE — Telephone Encounter (Signed)
Encounter opened in error

## 2024-01-27 IMAGING — US US ABDOMEN LIMITED
1 series · 14 of 25 positions shown · non-contrast
Comparison: None.

CLINICAL DATA: Epigastric pain.

EXAM:
ULTRASOUND ABDOMEN LIMITED RIGHT UPPER QUADRANT

[Series 1: us abdomen limited · 0.19mm/px · 14 of 44 slices shown]
[im 1/44]
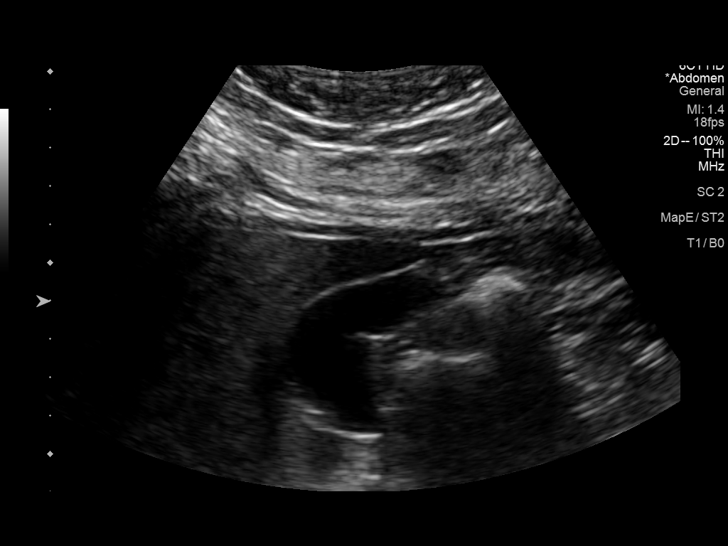
[im 4/44]
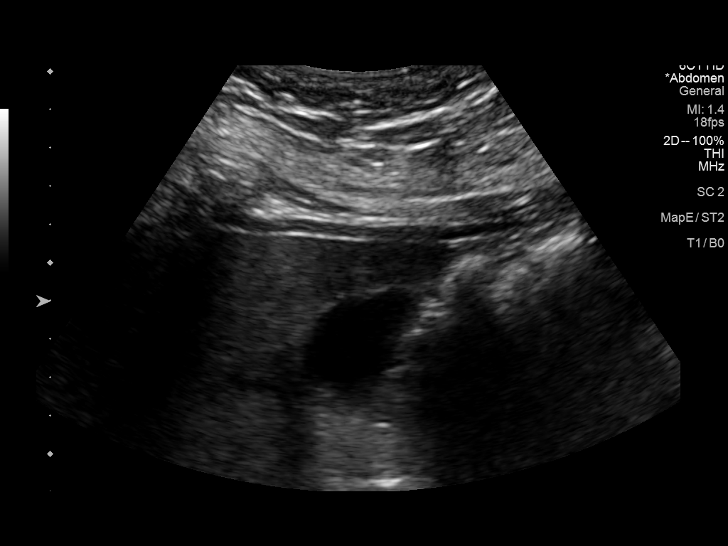
[im 8/44]
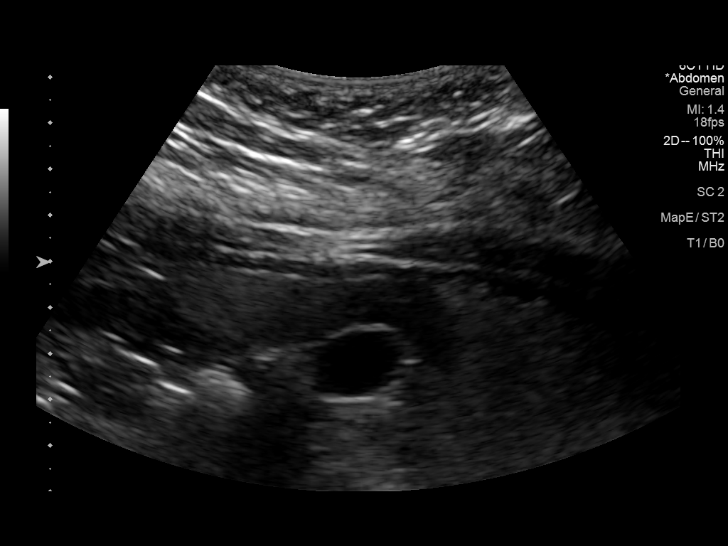
[im 11/44]
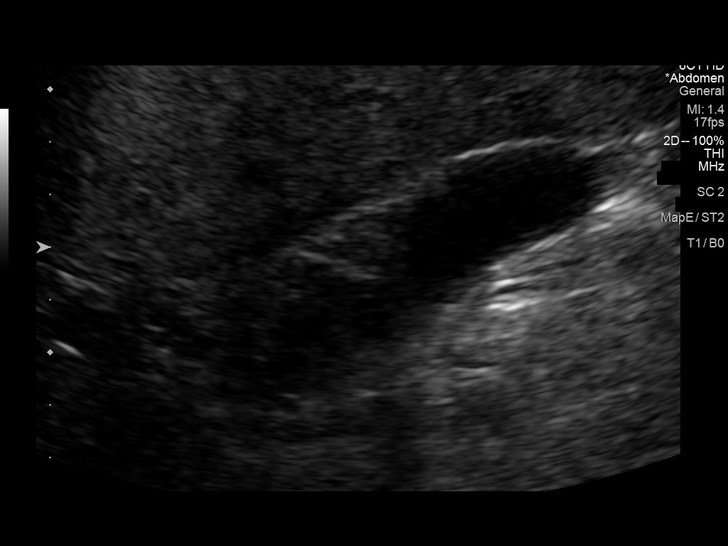
[im 15/44]
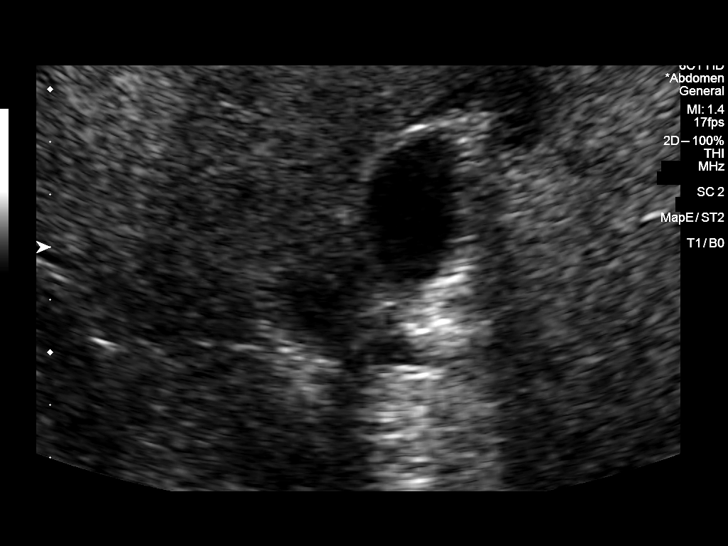
[im 17/44]
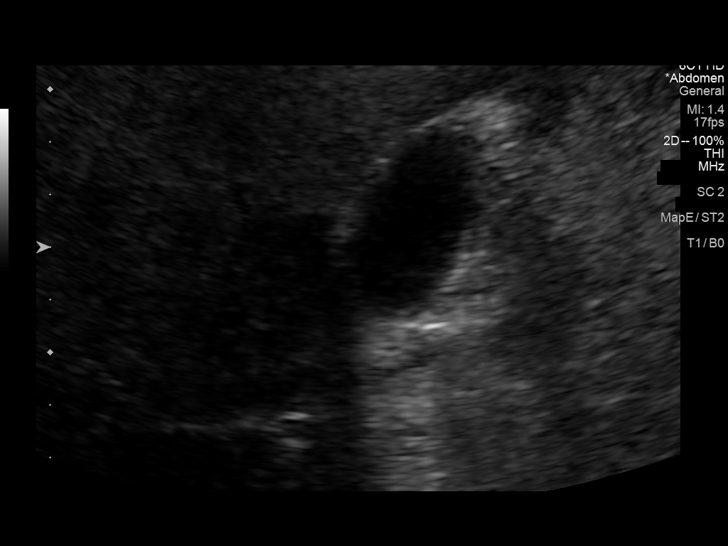
[im 20/44]
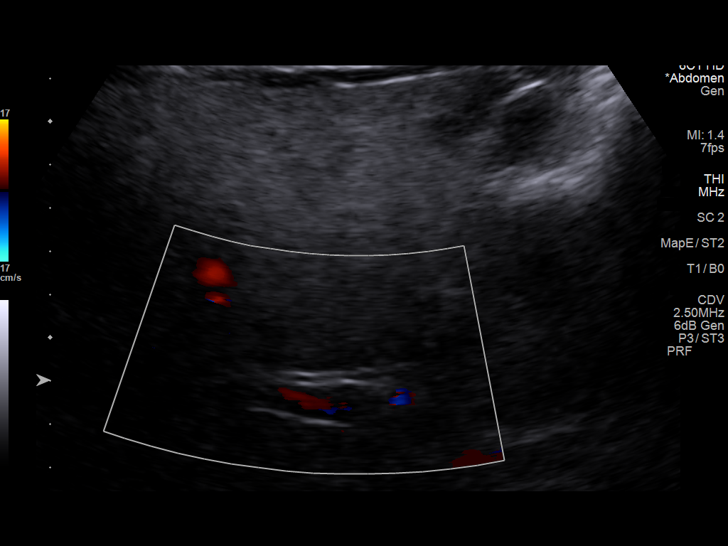
[im 24/44]
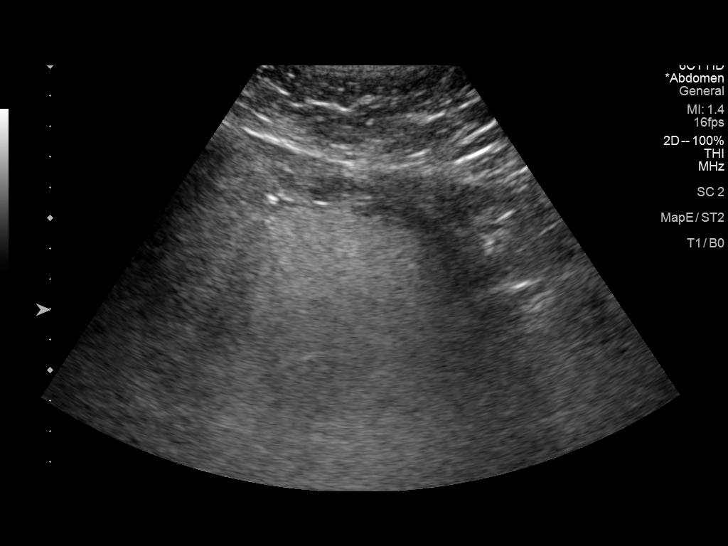
[im 27/44]
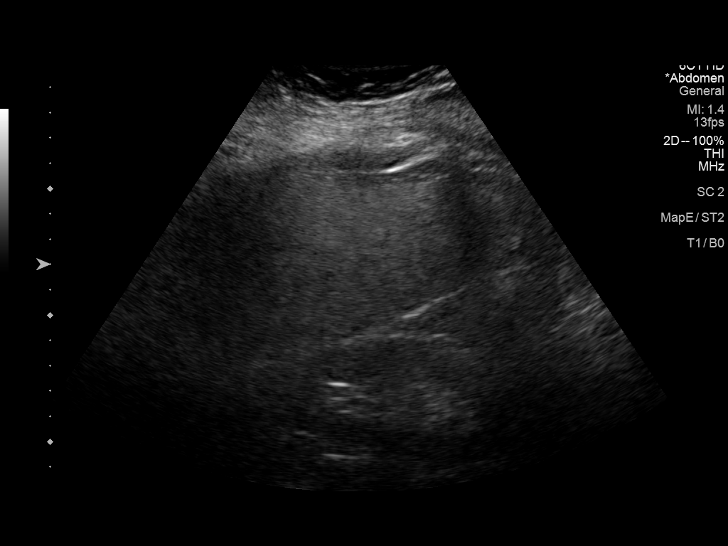
[im 29/44]
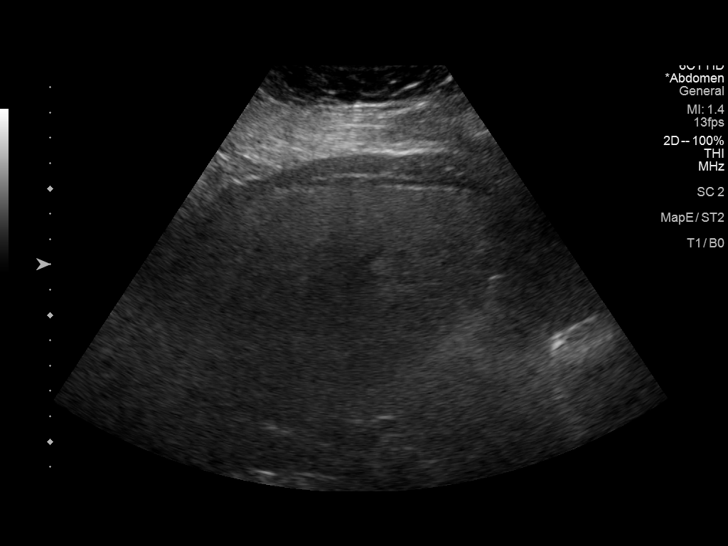
[im 33/44]
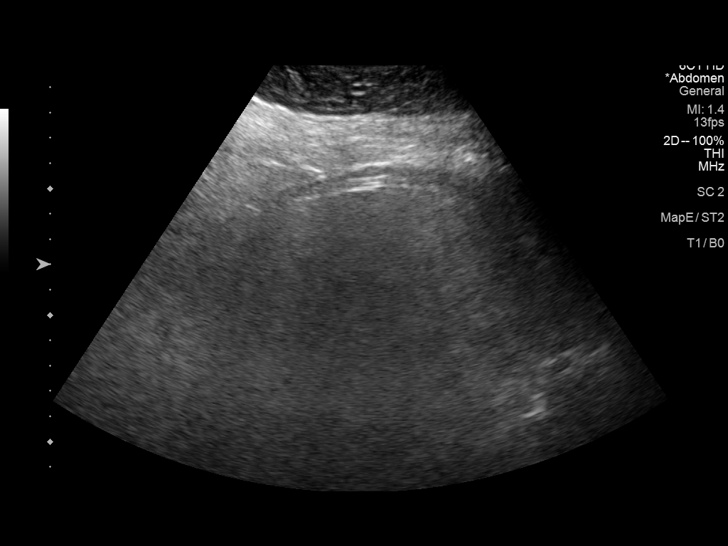
[im 36/44]
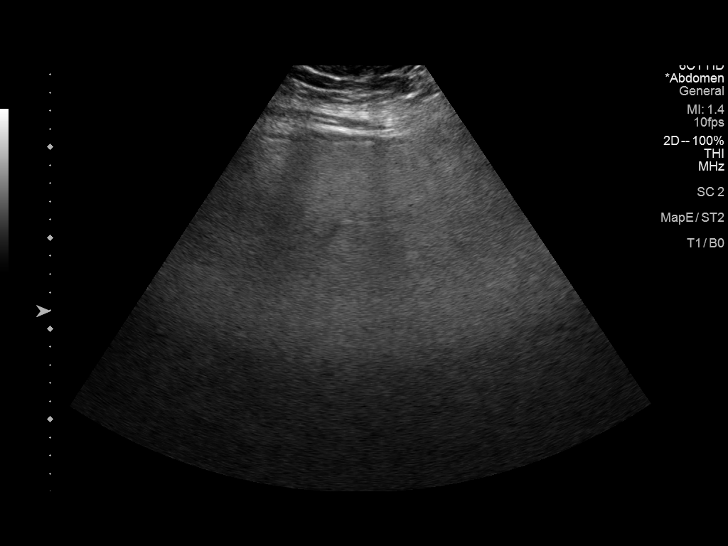
[im 40/44]
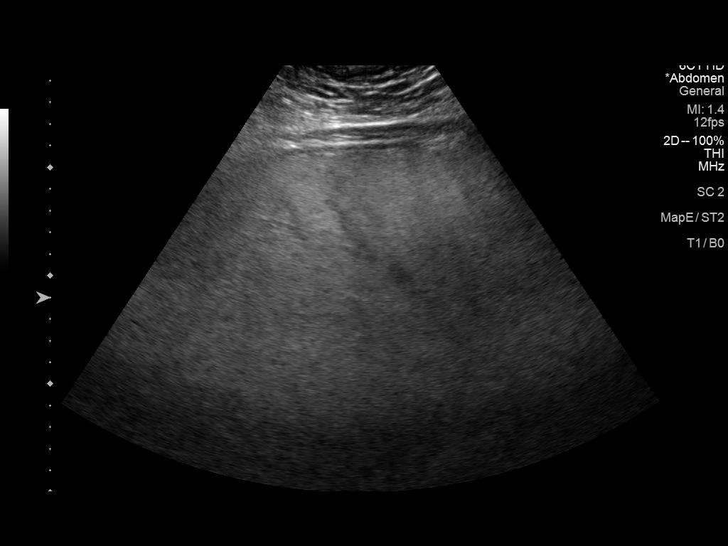
[im 44/44]
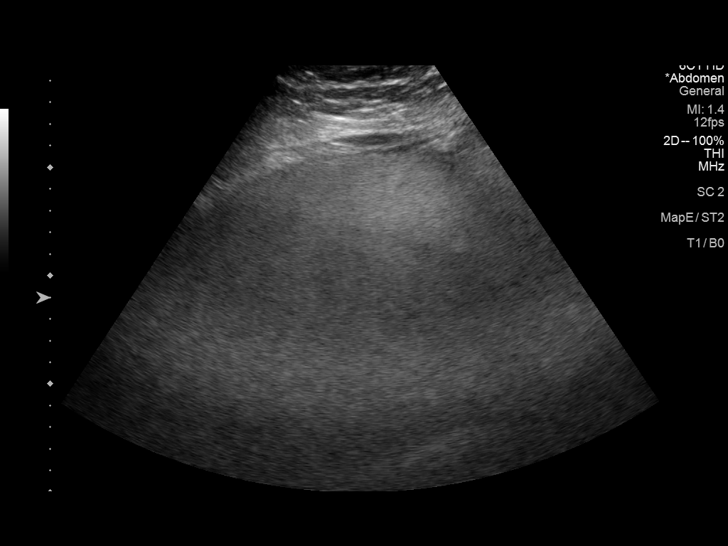

[14 of 25 positions shown; findings below may reference images not displayed]

FINDINGS: Gallbladder:

No gallstones or wall thickening visualized. No sonographic Murphy
sign noted by sonographer.

Common bile duct:

Diameter: 2.5 mm

Liver:

Diffuse increased echogenicity in the liver. The liver is otherwise
normal in appearance. Portal vein is patent on color Doppler imaging
with normal direction of blood flow towards the liver.

Other: None.
IMPRESSION: 1. Probable hepatic steatosis.  No other abnormalities.

## 2024-03-14 ENCOUNTER — Telehealth: Payer: Self-pay | Admitting: Nurse Practitioner

## 2024-03-14 NOTE — Telephone Encounter (Signed)
 Lvmtcb to schedule an appt if needed.
